# Patient Record
Sex: Female | Born: 1955 | Race: Black or African American | Hispanic: No | Smoking: Never smoker
Health system: Southern US, Community
[De-identification: ages and names within clinical notes are randomized; demographics above are authoritative.]

## PROBLEM LIST (undated history)

## (undated) DIAGNOSIS — E785 Hyperlipidemia, unspecified: Secondary | ICD-10-CM

## (undated) DIAGNOSIS — S3992XA Unspecified injury of lower back, initial encounter: Secondary | ICD-10-CM

## (undated) HISTORY — DX: Unspecified injury of lower back, initial encounter: S39.92XA

## (undated) HISTORY — DX: Hyperlipidemia, unspecified: E78.5

---

## 1987-07-18 HISTORY — PX: ABDOMINAL HYSTERECTOMY: SHX81

## 2003-04-24 ENCOUNTER — Encounter: Admission: RE | Admit: 2003-04-24 | Discharge: 2003-04-24 | Payer: Self-pay | Admitting: Specialist

## 2003-04-24 ENCOUNTER — Encounter: Payer: Self-pay | Admitting: Specialist

## 2007-12-03 ENCOUNTER — Encounter: Admission: RE | Admit: 2007-12-03 | Discharge: 2007-12-03 | Payer: Self-pay | Admitting: Family Medicine

## 2008-07-17 HISTORY — PX: HEMORRHOIDECTOMY WITH HEMORRHOID BANDING: SHX5633

## 2008-08-07 LAB — HM COLONOSCOPY

## 2008-12-03 ENCOUNTER — Encounter: Admission: RE | Admit: 2008-12-03 | Discharge: 2008-12-03 | Payer: Self-pay | Admitting: Family Medicine

## 2008-12-07 ENCOUNTER — Encounter: Admission: RE | Admit: 2008-12-07 | Discharge: 2008-12-07 | Payer: Self-pay | Admitting: Family Medicine

## 2009-12-06 ENCOUNTER — Encounter: Admission: RE | Admit: 2009-12-06 | Discharge: 2009-12-06 | Payer: Self-pay | Admitting: Family Medicine

## 2010-08-08 ENCOUNTER — Encounter: Payer: Self-pay | Admitting: Family Medicine

## 2010-11-16 ENCOUNTER — Other Ambulatory Visit: Payer: Self-pay | Admitting: Family Medicine

## 2010-11-16 DIAGNOSIS — Z1231 Encounter for screening mammogram for malignant neoplasm of breast: Secondary | ICD-10-CM

## 2010-12-09 ENCOUNTER — Ambulatory Visit
Admission: RE | Admit: 2010-12-09 | Discharge: 2010-12-09 | Disposition: A | Discharge status: Home / Self Care | Payer: 59 | Source: Ambulatory Visit | Attending: Family Medicine | Admitting: Family Medicine

## 2010-12-09 DIAGNOSIS — Z1231 Encounter for screening mammogram for malignant neoplasm of breast: Secondary | ICD-10-CM

## 2011-10-16 ENCOUNTER — Ambulatory Visit (INDEPENDENT_AMBULATORY_CARE_PROVIDER_SITE_OTHER): Payer: BC Managed Care – PPO | Admitting: Internal Medicine

## 2011-10-16 ENCOUNTER — Encounter: Payer: Self-pay | Admitting: Internal Medicine

## 2011-10-16 VITALS — BP 152/96 | HR 78 | Temp 98.3°F | Ht 65.0 in | Wt 176.0 lb

## 2011-10-16 DIAGNOSIS — R030 Elevated blood-pressure reading, without diagnosis of hypertension: Secondary | ICD-10-CM

## 2011-10-16 DIAGNOSIS — R635 Abnormal weight gain: Secondary | ICD-10-CM | POA: Insufficient documentation

## 2011-10-16 DIAGNOSIS — Z0000 Encounter for general adult medical examination without abnormal findings: Secondary | ICD-10-CM | POA: Insufficient documentation

## 2011-10-16 DIAGNOSIS — E785 Hyperlipidemia, unspecified: Secondary | ICD-10-CM | POA: Insufficient documentation

## 2011-10-16 DIAGNOSIS — IMO0001 Reserved for inherently not codable concepts without codable children: Secondary | ICD-10-CM

## 2011-10-16 LAB — CBC WITH DIFFERENTIAL/PLATELET
Basophils Relative: 0.9 % (ref 0.0–3.0)
Eosinophils Relative: 3.7 % (ref 0.0–5.0)
HCT: 41 % (ref 36.0–46.0)
Lymphs Abs: 2.9 10*3/uL (ref 0.7–4.0)
MCV: 87.4 fl (ref 78.0–100.0)
Monocytes Absolute: 0.4 10*3/uL (ref 0.1–1.0)
Neutrophils Relative %: 33.2 % — ABNORMAL LOW (ref 43.0–77.0)
RBC: 4.69 Mil/uL (ref 3.87–5.11)
WBC: 5.3 10*3/uL (ref 4.5–10.5)

## 2011-10-16 LAB — COMPREHENSIVE METABOLIC PANEL
Albumin: 4.3 g/dL (ref 3.5–5.2)
Alkaline Phosphatase: 56 U/L (ref 39–117)
BUN: 15 mg/dL (ref 6–23)
CO2: 29 mEq/L (ref 19–32)
Calcium: 9.1 mg/dL (ref 8.4–10.5)
GFR: 81.34 mL/min (ref >60.00)
Glucose, Bld: 106 mg/dL — ABNORMAL HIGH (ref 70–99)
Potassium: 3.5 mEq/L (ref 3.5–5.1)

## 2011-10-16 LAB — LIPID PANEL
Cholesterol: 228 mg/dL — ABNORMAL HIGH (ref 0–200)
VLDL: 14 mg/dL (ref 0.0–40.0)

## 2011-10-16 NOTE — Assessment & Plan Note (Signed)
Gaining weight for the last 2 years, reports about 7 pounds, thinks is related to lack of exercise due to being very busy. Plan: Labs Diet exercise discussed

## 2011-10-16 NOTE — Patient Instructions (Addendum)
Check the  blood pressure 2 or 3 times a week, be sure it is less than 135/85   Sodium-Controlled Diet Sodium is a mineral. It is found in many foods. Sodium may be found naturally or added during the making of a food. The most common form of sodium is salt, which is made up of sodium and chloride. Reducing your sodium intake involves changing your eating habits. The following guidelines will help you reduce the sodium in your diet:  Stop using the salt shaker.   Use salt sparingly in cooking and baking.   Substitute with sodium-free seasonings and spices.   Do not use a salt substitute (potassium chloride) without your caregiver's permission.   Include a variety of fresh, unprocessed foods in your diet.   Limit the use of processed and convenience foods that are high in sodium.  USE THE FOLLOWING FOODS SPARINGLY: Breads/Starches  Commercial bread stuffing, commercial pancake or waffle mixes, coating mixes. Waffles. Croutons. Prepared (boxed or frozen) potato, rice, or noodle mixes that contain salt or sodium. Salted [place] fries or hash browns. Salted popcorn, breads, crackers, chips, or snack foods.  Vegetables  Vegetables canned with salt or prepared in cream, butter, or cheese sauces. Sauerkraut. Tomato or vegetable juices canned with salt.   Fresh vegetables are allowed if rinsed thoroughly.  Fruit  Fruit is okay to eat.  Meat and Meat Substitutes  Salted or smoked meats, such as bacon or Canadian bacon, chipped or corned beef, hot dogs, salt pork, luncheon meats, pastrami, ham, or sausage. Canned or smoked fish, poultry, or meat. Processed cheese or cheese spreads, blue or Roquefort cheese. Battered or frozen fish products. Prepared spaghetti sauce. Baked beans. Reuben sandwiches. Salted nuts. Caviar.  Milk  Limit buttermilk to 1 cup per week.  Soups and Combination Foods  Bouillon cubes, canned or dried soups, broth, consomm. Convenience (frozen or packaged) dinners with  more than 600 mg sodium. Pot pies, pizza, Asian food, fast food cheeseburgers, and specialty sandwiches.  Desserts and Sweets  Regular (salted) desserts, pie, commercial fruit snack pies, commercial snack cakes, canned puddings.   Eat desserts and sweets in moderation.  Fats and Oils  Gravy mixes or canned gravy. No more than 1 to 2 tbs of salad dressing. Chip dips.   Eat fats and oils in moderation.  Beverages  See those listed under the vegetables and milk groups.  Condiments  Ketchup, mustard, meat sauces, salsa, regular (salted) and lite soy sauce or mustard. Dill pickles, olives, meat tenderizer. Prepared horseradish or pickle relish. Dutch-processed cocoa. Baking powder or baking soda used medicinally. Worcestershire sauce. "Light" salt. Salt substitute, unless approved by your caregiver.  Document Released: 12/23/2001 Document Revised: 06/22/2011 Document Reviewed: 07/26/2009 [facility] Patient Information 2012 [employer]85. If it is consistently higher, let me know

## 2011-10-16 NOTE — Assessment & Plan Note (Signed)
History of high cholesterol, took Crestor one time for 1 week and developed myalgias. Currently on no medications. Labs. Diet discussed.

## 2011-10-16 NOTE — Assessment & Plan Note (Addendum)
Reports a colonoscopy in 2009. Refer to gynecology. Next MMG due 11-2011

## 2011-10-16 NOTE — Assessment & Plan Note (Signed)
BP noted to be slightly elevated today, no history of hypertension, no ambulatory BPs. Will ask patient to check her BP at home. See instructions

## 2011-10-16 NOTE — Progress Notes (Signed)
  Subjective:    Patient ID: Anne Ramirez, female    DOB: 03-Nov-1955, 56 y.o.   MRN: [id number]  HPI New patient Complaint of weight gain for the last year, reports no change in her diet which is usually healthy but has not been able to exercise as much because she is very busy. Has  noted some axillary dimpling. Also has a history of high cholesterol, briefly took Crestor in the past and d/c it due to myalgias. Her BP is noted to be elevated today, she has no history of hypertension, has not checked her BPs at home.  Past medical history Hyperlipidemia, took crestor x 1 week, self d/c (myalgias) Back Injury 2007 Shoulder injury 2011, sees orthopedic surgery when necessary. 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 Denies any anxiety or depression    Objective:   Physical Exam  General -- alert, well-developed, and well-nourished. NAD  Neck --no LADs, no thyromegaly Lungs -- normal respiratory effort, no intercostal retractions, no accessory muscle use, and normal breath sounds.   Heart-- normal rate, regular rhythm, no murmur, and no gallop.   Extremities-- no pretibial edema bilaterally . Axillary areas normal inspection and palpation. No mass or lymphadenopathy noted. No peau d' orange  or dimpling      Assessment & Plan:

## 2011-10-18 ENCOUNTER — Ambulatory Visit: Payer: 59 | Admitting: Internal Medicine

## 2011-10-18 LAB — TSH: TSH: 1.17 u[IU]/mL (ref 0.35–5.50)

## 2011-10-20 ENCOUNTER — Encounter: Payer: Self-pay | Admitting: Internal Medicine

## 2011-10-24 ENCOUNTER — Encounter: Payer: Self-pay | Admitting: *Deleted

## 2012-01-11 ENCOUNTER — Other Ambulatory Visit: Payer: Self-pay | Admitting: Internal Medicine

## 2012-01-11 DIAGNOSIS — Z1231 Encounter for screening mammogram for malignant neoplasm of breast: Secondary | ICD-10-CM

## 2012-01-30 ENCOUNTER — Ambulatory Visit
Admission: RE | Admit: 2012-01-30 | Discharge: 2012-01-30 | Disposition: A | Discharge status: Home / Self Care | Payer: BC Managed Care – PPO | Source: Ambulatory Visit | Attending: Internal Medicine | Admitting: Internal Medicine

## 2012-01-30 DIAGNOSIS — Z1231 Encounter for screening mammogram for malignant neoplasm of breast: Secondary | ICD-10-CM

## 2012-04-18 ENCOUNTER — Ambulatory Visit (INDEPENDENT_AMBULATORY_CARE_PROVIDER_SITE_OTHER): Payer: BC Managed Care – PPO | Admitting: Internal Medicine

## 2012-04-18 ENCOUNTER — Encounter: Payer: Self-pay | Admitting: *Deleted

## 2012-04-18 VITALS — BP 136/84 | HR 79 | Temp 98.8°F | Wt 176.0 lb

## 2012-04-18 DIAGNOSIS — B349 Viral infection, unspecified: Secondary | ICD-10-CM

## 2012-04-18 DIAGNOSIS — B9789 Other viral agents as the cause of diseases classified elsewhere: Secondary | ICD-10-CM

## 2012-04-18 NOTE — Patient Instructions (Addendum)
Rest, fluids , tylenol If mild cough, congestion, try Mucinex DM twice a day as needed  Call if no better in few days Call anytime if the symptoms are severe, you have high fever,  headache, rash, severe respiratory symptoms

## 2012-04-18 NOTE — Progress Notes (Signed)
  Subjective:    Patient ID: Anne Ramirez, female    DOB: [birth year], 56 y.o.   MRN: [id number]  HPI Acute visit Yesterday developed generalized myalgias with essentially no arthralgias, she felt unwell in general. Started to take Tylenol frequently, thinks she may have the flu. Her daughter has what seems to be a URI.  Past medical history Hyperlipidemia, took crestor x 1 week, self d/c (myalgias) Back Injury 2007 Shoulder injury 2011, sees orthopedic surgery when necessary. 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 She reports subjective fevers but no chills Denies cough, nausea, vomiting, diarrhea. No dysuria or gross hematuria No rash or headaches.     Objective:   Physical Exam General -- alert, well-developed,. No apparent distress.  HEENT -- TMs normal, throat w/o redness, face symmetric and not tender to palpation, nose not congested , some hoarseness  Lungs -- normal respiratory effort, no intercostal retractions, no accessory muscle use, and normal breath sounds.   Heart-- normal rate, regular rhythm, no murmur, and no gallop.   Extremities-- no pretibial edema bilaterally  Neurologic-- alert & oriented X3 and strength normal in all extremities. Psych-- Cognition and judgment appear intact. Alert and cooperative with normal attention span and concentration.  not anxious appearing and not depressed appearing.       Assessment & Plan:  Viral syndrome? One-day history of myalgias without respiratory symptoms except for some  hoarseness. Flu Test (-) I don't see much evidence of a flu infection at this point. Recommend conservative treatment. Pt request a flu shot, given sx , rec to wait few days

## 2012-04-19 ENCOUNTER — Encounter: Payer: Self-pay | Admitting: Internal Medicine

## 2012-05-19 ENCOUNTER — Encounter (HOSPITAL_COMMUNITY): Payer: Self-pay | Admitting: Emergency Medicine

## 2012-05-19 ENCOUNTER — Emergency Department (HOSPITAL_COMMUNITY)
Admission: EM | Admit: 2012-05-19 | Discharge: 2012-05-19 | Disposition: A | Discharge status: Home / Self Care | Payer: Worker's Compensation | Attending: Emergency Medicine | Admitting: Emergency Medicine

## 2012-05-19 DIAGNOSIS — Y9389 Activity, other specified: Secondary | ICD-10-CM | POA: Insufficient documentation

## 2012-05-19 DIAGNOSIS — Y990 Civilian activity done for income or pay: Secondary | ICD-10-CM | POA: Insufficient documentation

## 2012-05-19 DIAGNOSIS — X500XXA Overexertion from strenuous movement or load, initial encounter: Secondary | ICD-10-CM | POA: Insufficient documentation

## 2012-05-19 DIAGNOSIS — Y9210 Unspecified residential institution as the place of occurrence of the external cause: Secondary | ICD-10-CM | POA: Insufficient documentation

## 2012-05-19 DIAGNOSIS — Z8619 Personal history of other infectious and parasitic diseases: Secondary | ICD-10-CM | POA: Insufficient documentation

## 2012-05-19 DIAGNOSIS — S335XXA Sprain of ligaments of lumbar spine, initial encounter: Secondary | ICD-10-CM | POA: Insufficient documentation

## 2012-05-19 DIAGNOSIS — S39012A Strain of muscle, fascia and tendon of lower back, initial encounter: Secondary | ICD-10-CM

## 2012-05-19 LAB — RAPID URINE DRUG SCREEN, HOSP PERFORMED
Amphetamines: NOT DETECTED
Barbiturates: NOT DETECTED
Benzodiazepines: NOT DETECTED
Cocaine: NOT DETECTED
Tetrahydrocannabinol: NOT DETECTED

## 2012-05-19 MED ORDER — CYCLOBENZAPRINE HCL 5 MG PO TABS: 5.0000 mg | ORAL_TABLET | Freq: Every evening | ORAL | Status: DC | PRN

## 2012-05-19 MED ORDER — PREDNISONE 20 MG PO TABS: 20.0000 mg | ORAL_TABLET | Freq: Every day | ORAL | Status: DC

## 2012-05-19 MED ORDER — IBUPROFEN 800 MG PO TABS
800.0000 mg | ORAL_TABLET | Freq: Once | ORAL | Status: AC
  Administered 2012-05-19: 800 mg via ORAL
  Filled 2012-05-19: qty 1

## 2012-05-19 MED ORDER — ACETAMINOPHEN 500 MG PO TABS
500.0000 mg | ORAL_TABLET | Freq: Four times a day (QID) | ORAL | Status: DC | PRN
Start: 2012-05-19 — End: 2017-03-27
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
## 2012-05-19 NOTE — ED Provider Notes (Signed)
Medical screening examination/treatment/procedure(s) were performed by non-physician practitioner and as supervising physician I was immediately available for consultation/collaboration.  Doug Sou, MD 05/19/12 [phone]

## 2012-05-19 NOTE — ED Provider Notes (Signed)
History     CSN: [id number]  Arrival date & time 05/19/12  0907   None     Chief Complaint  Patient presents with  . Back Pain    (Consider location/radiation/quality/duration/timing/severity/associated sxs/prior treatment) HPI  Pt to the ER for evaluation of left side low back pain. 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 She did not fall. She did not have immediate pain. She woke up Saturday morning really sore and then this morning woke up feeling much worse and not wanting to ambulate because of the pain. She denies weakness, numbness tingling in the leg. She denies midline tenderness/pain or pain of her hip girdle. She has not had any associated systemic symptoms. She is a Engineer, civil (consulting) here at [employer] and requests a drug screen be added. nad vss  Past Medical History  Diagnosis Date  . Chicken pox     Past Surgical History  Procedure Date  . Abdominal hysterectomy 1989  . Hemorrhoidectomy with hemorrhoid banding     Family History  Problem Relation Age of Onset  . Kidney disease Brother   . Hyperlipidemia Maternal Grandmother   . Stroke Maternal Grandmother   . Hypertension Maternal Grandmother   . Hyperlipidemia Maternal Grandfather   . Stroke Maternal Grandfather   . Hypertension Maternal Grandfather   . Hyperlipidemia Paternal Grandmother   . Stroke Paternal Grandmother   . Hypertension Paternal Grandmother   . Hyperlipidemia Paternal Grandfather   . Stroke Paternal Grandfather   . Hypertension Paternal Grandfather     History  Substance Use Topics  . Smoking status: Never Smoker   . Smokeless tobacco: Never Used  . Alcohol Use: No    OB History    Grav Para Term Preterm Abortions TAB SAB Ect Mult Living                  Review of Systems  Review of Systems  Gen: no weight loss, fevers, chills, night sweats  Eyes: no discharge or  drainage, no occular pain or visual changes  Nose: no epistaxis or rhinorrhea  Mouth: no dental pain, no sore throat  Neck: no neck pain  Lungs:No wheezing, coughing or hemoptysis CV: no chest pain, palpitations, dependent edema or orthopnea  Abd: no abdominal pain, nausea, vomiting  GU: no dysuria or gross hematuria  MSK:  Left low back injury  Neuro: no headache, no focal neurologic deficits  Skin: no abnormalities Psyche: negative.   Allergies  Other  Home Medications   Current Outpatient Rx  Name  Route  Sig  Dispense  Refill  . IBUPROFEN 200 MG PO TABS   Oral   Take 400 mg by mouth every 6 (six) hours as needed. For pain         . NAPROXEN SODIUM 220 MG PO TABS   Oral   Take 220-440 mg by mouth daily as needed. For pain         . ACETAMINOPHEN 500 MG PO TABS   Oral   Take 1 tablet (500 mg total) by mouth every 6 (six) hours as needed for pain.   30 tablet   0   . CYCLOBENZAPRINE HCL 5 MG PO TABS   Oral   Take 1 tablet (5 mg total) by mouth at bedtime as needed for muscle spasms.   30 tablet   0   .  PREDNISONE 20 MG PO TABS   Oral   Take 1 tablet (20 mg total) by mouth daily.   21 tablet   0     6 tabs on day 1, 5 tabs on day 2, 4 tabs on day 3, ...     BP 140/89  Pulse 76  Temp 98.1 F (36.7 C) (Oral)  Resp 20  Ht 5\' 5"  (1.651 m)  Wt 168 lb (76.204 kg)  BMI 27.96 kg/m2  SpO2 99%  Physical Exam  Nursing note and vitals reviewed. Constitutional: She appears well-developed and well-nourished. No distress.  HENT:  Head: Normocephalic and atraumatic.  Eyes: Pupils are equal, round, and reactive to light.  Neck: Normal range of motion. Neck supple.  Cardiovascular: Normal rate and regular rhythm.   Pulmonary/Chest: Effort normal.  Abdominal: Soft.  Musculoskeletal:       Lumbar back: She exhibits tenderness (tenderness to palpation of the soft tissue of the left low back.) and spasm. She exhibits normal range of motion, no bony tenderness, no  swelling, no edema, no deformity, no laceration, no pain and normal pulse.       Back:        Equal strength to bilateral lower extremities. Neurosensory function adequate to both legs. Skin color is normal. Skin is warm and moist. I see no step off deformity, no bony tenderness. Pain is relieved when sitting in certain positions. ROM is decreased due to pain. No crepitus, laceration, effusion, swelling.  Pulses are normal  Pain does not involve boney structures.   Neurological: She is alert.  Skin: Skin is warm and dry.    ED Course  Procedures (including critical care time)   Labs Reviewed  URINE RAPID DRUG SCREEN (HOSP PERFORMED)   No results found.   1. Low back strain       MDM  Pt did not have impact with object or fall onto floor. Her pain is clearly only involving the soft tissues of her low back. She does not feel or want and xray and I think that this is appropriate. She needs a urine drug screen and I have ordered it. She says that narcotics make her sleepy. Prednisone dose back, Tylenol and Flexeril Rx.  Pt given a referral to Dr. Ophelia Charter.  Pt has been advised of the symptoms that warrant their return to the ED. Patient has voiced understanding and has agreed to follow-up with the PCP or specialist.         Dorthula Matas, PA 05/19/12 1014

## 2012-10-29 ENCOUNTER — Other Ambulatory Visit: Payer: Self-pay

## 2012-10-29 DIAGNOSIS — Z1231 Encounter for screening mammogram for malignant neoplasm of breast: Secondary | ICD-10-CM

## 2012-11-25 ENCOUNTER — Ambulatory Visit (INDEPENDENT_AMBULATORY_CARE_PROVIDER_SITE_OTHER): Payer: BC Managed Care – PPO | Admitting: Internal Medicine

## 2012-11-25 ENCOUNTER — Encounter: Payer: Self-pay | Admitting: Internal Medicine

## 2012-11-25 VITALS — BP 136/84 | HR 75 | Temp 98.1°F | Ht 65.0 in | Wt 174.0 lb

## 2012-11-25 DIAGNOSIS — Z0000 Encounter for general adult medical examination without abnormal findings: Secondary | ICD-10-CM

## 2012-11-25 LAB — LDL CHOLESTEROL, DIRECT: Direct LDL: 163.2 mg/dL

## 2012-11-25 LAB — CBC WITH DIFFERENTIAL/PLATELET
Basophils Absolute: 0.1 10*3/uL (ref 0.0–0.1)
Basophils Relative: 0.9 % (ref 0.0–3.0)
Eosinophils Absolute: 0.2 10*3/uL (ref 0.0–0.7)
HCT: 42.9 % (ref 36.0–46.0)
Hemoglobin: 14.4 g/dL (ref 12.0–15.0)
Lymphs Abs: 3.3 10*3/uL (ref 0.7–4.0)
MCHC: 33.5 g/dL (ref 30.0–36.0)
Neutro Abs: 2.5 10*3/uL (ref 1.4–7.7)
RBC: 5.06 Mil/uL (ref 3.87–5.11)
RDW: 14.4 % (ref 11.5–14.6)

## 2012-11-25 LAB — COMPREHENSIVE METABOLIC PANEL
ALT: 17 U/L (ref 0–35)
AST: 16 U/L (ref 0–37)
Alkaline Phosphatase: 57 U/L (ref 39–117)
BUN: 15 mg/dL (ref 6–23)
Creatinine, Ser: 0.7 mg/dL (ref 0.4–1.2)
Total Bilirubin: 0.6 mg/dL (ref 0.3–1.2)

## 2012-11-25 LAB — LIPID PANEL: VLDL: 29.6 mg/dL (ref 0.0–40.0)

## 2012-11-25 LAB — TSH: TSH: 0.73 u[IU]/mL (ref 0.35–5.50)

## 2012-11-25 NOTE — Patient Instructions (Addendum)
Zostavax? Check the  blood pressure 2 or 3 times a month, be sure it is between 110/60 and 140/85. If it is consistently higher or lower, let me know Next mammogram 01-2013, if you need a referral l, let me know Next visit here 1 year

## 2012-11-25 NOTE — Assessment & Plan Note (Addendum)
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 Will ask [facility]  Gynecology last year, no records, will ask for records  MMG 01-2012 neg, breast exam today (-) Diet-exercise discussed  See instructions Addendum:  Colonoscopy 08/07/2008, no polyps, repeat in 5 years Dr. Juliann Mule gynecology 11/13/2011, Pap smear negative, HPV negative.Dr Ernestina Penna

## 2012-11-25 NOTE — Progress Notes (Signed)
  Subjective:    Patient ID: Anne Ramirez, female    DOB: [birth year], 57 y.o.   MRN: [id number]  HPI CPX  Past medical history Hyperlipidemia, took crestor x 1 week, self d/c (myalgias) Back Injury 2007 Shoulder injury 2011, sees orthopedic surgery when necessary. 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 No dysuria or gross hematuria. No vaginal bleeding. occ back ache-shoulder pain, on tylenol prn    Objective:   Physical Exam BP 136/84  Pulse 75  Temp(Src) 98.1 F (36.7 C) (Oral)  Ht 5\' 5"  (1.651 m)  Wt 174 lb (78.926 kg)  BMI 28.96 kg/m2  SpO2 94%  General -- alert, well-developed,  Neck --no thyromegaly Breasts-- No mass, nodules, thickening, tenderness, bulging, retraction, inflamation, nipple discharge or skin changes noted.  no axillary lymph nodes Lungs -- normal respiratory effort, no intercostal retractions, no accessory muscle use, and normal breath sounds.   Heart-- normal rate, regular rhythm, no murmur, and no gallop.   Abdomen--soft, non-tender, no distention, no masses, no HSM, no guarding, and no rigidity.   Extremities-- no pretibial edema bilaterally  Neurologic-- alert & oriented X3 and strength normal in all extremities. Psych-- Cognition and judgment appear intact. Alert and cooperative with normal attention span and concentration.  not anxious appearing and not depressed appearing.        Assessment & Plan:

## 2012-12-03 ENCOUNTER — Encounter: Payer: Self-pay | Admitting: *Deleted

## 2012-12-12 LAB — HM PAP SMEAR: HM Pap smear: NEGATIVE

## 2012-12-17 ENCOUNTER — Encounter: Payer: Self-pay | Admitting: Internal Medicine

## 2013-02-07 ENCOUNTER — Ambulatory Visit
Admission: RE | Admit: 2013-02-07 | Discharge: 2013-02-07 | Disposition: A | Discharge status: Home / Self Care | Payer: BC Managed Care – PPO | Source: Ambulatory Visit

## 2013-02-07 DIAGNOSIS — Z1231 Encounter for screening mammogram for malignant neoplasm of breast: Secondary | ICD-10-CM

## 2013-04-04 ENCOUNTER — Encounter: Payer: Self-pay | Admitting: Internal Medicine

## 2013-04-09 NOTE — Telephone Encounter (Signed)
Error

## 2013-11-25 ENCOUNTER — Telehealth: Payer: Self-pay

## 2013-11-25 NOTE — Telephone Encounter (Signed)
Phone number switches to fax machine  Pap--11/2012--neg--neg HPV--Dr Fogleman [facility] --2010--q 5 years--Dr Bosie ClosSchooler MMG---01/2013---Neg Bi Rads

## 2013-11-26 ENCOUNTER — Telehealth: Payer: Self-pay | Admitting: Internal Medicine

## 2013-11-26 ENCOUNTER — Encounter: Payer: BC Managed Care – PPO | Admitting: Internal Medicine

## 2013-11-26 NOTE — Telephone Encounter (Signed)
Caller name:  Rielle  Call back number:[phone]   Reason for call:    [facility]  Call back after 11am

## 2013-12-01 ENCOUNTER — Ambulatory Visit (INDEPENDENT_AMBULATORY_CARE_PROVIDER_SITE_OTHER): Payer: BC Managed Care – PPO | Admitting: Internal Medicine

## 2013-12-01 ENCOUNTER — Encounter: Payer: Self-pay | Admitting: Internal Medicine

## 2013-12-01 VITALS — BP 148/89 | HR 73 | Temp 98.4°F | Ht 65.0 in | Wt 173.0 lb

## 2013-12-01 DIAGNOSIS — Z0000 Encounter for general adult medical examination without abnormal findings: Secondary | ICD-10-CM

## 2013-12-01 DIAGNOSIS — IMO0001 Reserved for inherently not codable concepts without codable children: Secondary | ICD-10-CM

## 2013-12-01 DIAGNOSIS — R030 Elevated blood-pressure reading, without diagnosis of hypertension: Secondary | ICD-10-CM

## 2013-12-01 NOTE — Assessment & Plan Note (Signed)
BP again slt elevated , willcontinue monitoring it at home

## 2013-12-01 NOTE — Patient Instructions (Signed)
Get your blood work before you leave   Next visit is for a physical exam in 1 year ,  fasting Please make an appointment     Check the  blood pressure 2  times a month   be sure it is between 110/60 and 140/85. Ideal blood pressure is 120/80. If it is consistently higher or lower, let me know

## 2013-12-01 NOTE — Assessment & Plan Note (Addendum)
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 Bosie ClosSchooler----> will arrange   [facility], Pap smear negative, HPV negative.Dr Ernestina PennaFogleman MMG [id number] neg   Diet-exercise discussed EKG normal sinus rhythm, no acute changes, no previous EKGs to compare with.

## 2013-12-01 NOTE — Progress Notes (Signed)
Subjective:    Patient ID: Anne Ramirez, female    DOB: February 02, 1956, 58 y.o.   MRN: [id number]  DOS:  12/01/2013 Type of  visit: CPX, in general feeling well, no concerns  ROS Diet-- healthy Exercise-- active, yard work  No  CP, SOB No palpitations, no lower extremity edema Denies  nausea, vomiting diarrhea Denies  blood in the stools (-) cough, sputum production (-) wheezing, chest congestion No dysuria, gross hematuria, difficulty urinating  No anxiety, depression    Past Medical History  Diagnosis Date  . Hyperlipidemia     h/o myalgias w/ crestor  . Back injury     2007    Past Surgical History  Procedure Laterality Date  . Abdominal hysterectomy  1989    Hyst and oophorectomy  . Hemorrhoidectomy with hemorrhoid banding  2010    History   Social History  . Marital Status: Unknown    Spouse Name: N/A    Number of Children: 2  . Years of Education: N/A   Occupational History  . RN    Social History Main Topics  . Smoking status: Never Smoker   . Smokeless tobacco: Never Used  . Alcohol Use: No  . Drug Use: No  . Sexual Activity:    Other Topics Concern  . 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 Kidney disease Brother   . Hyperlipidemia Maternal Grandmother   . Stroke Maternal Grandmother   . Hypertension Maternal Grandmother   . Hyperlipidemia Maternal Grandfather   . Stroke Maternal Grandfather   . Hypertension Maternal Grandfather   . Hyperlipidemia Paternal Grandmother   . Stroke Paternal Grandmother   . Hypertension Paternal Grandmother   . Hyperlipidemia Paternal Grandfather   . Stroke Paternal Grandfather   . Hypertension Paternal Grandfather   . Colon cancer Neg Hx   . Breast cancer Neg Hx         Medication List       This list is accurate as of: 12/01/13 11:59 PM.  Always use your most recent med list.               acetaminophen 500 MG tablet  Commonly known as:  TYLENOL  Take 1 tablet (500 mg total) by mouth every 6 (six) hours as needed for pain.           Objective:   Physical Exam BP 148/89  Pulse 73  Temp(Src) 98.4 F (36.9 C) (Oral)  Ht 5\' 5"  (1.651 m)  Wt 173 lb (78.472 kg)  BMI 28.79 kg/m2  SpO2 98%  General -- alert, well-developed, NAD.  Neck --no thyromegaly  HEENT-- Not pale.  Lungs -- normal respiratory effort, no intercostal retractions, no accessory muscle use, and normal breath sounds.  Heart-- normal rate, regular rhythm, no murmur.  Abdomen-- Not distended, good bowel sounds,soft, non-tender.  Extremities-- no pretibial edema bilaterally  Neurologic--  alert & oriented X3. Speech normal, gait normal, strength normal in all extremities.  Psych-- Cognition and judgment appear intact. Cooperative with normal attention span and concentration. No anxious or depressed appearing.          Assessment & Plan:

## 2013-12-01 NOTE — Progress Notes (Signed)
Pre-visit discussion using our clinic review tool. No additional management support is needed unless otherwise documented below in the visit note.  

## 2013-12-02 LAB — CBC WITH DIFFERENTIAL/PLATELET
BASOS PCT: 1.4 % (ref 0.0–3.0)
Basophils Absolute: 0.1 10*3/uL (ref 0.0–0.1)
EOS PCT: 3.2 % (ref 0.0–5.0)
Eosinophils Absolute: 0.2 10*3/uL (ref 0.0–0.7)
HEMATOCRIT: 43.2 % (ref 36.0–46.0)
Hemoglobin: 14.1 g/dL (ref 12.0–15.0)
LYMPHS ABS: 3.4 10*3/uL (ref 0.7–4.0)
Lymphocytes Relative: 47.7 % — ABNORMAL HIGH (ref 12.0–46.0)
MCHC: 32.7 g/dL (ref 30.0–36.0)
MCV: 87.7 fl (ref 78.0–100.0)
MONO ABS: 0.3 10*3/uL (ref 0.1–1.0)
Monocytes Relative: 4.8 % (ref 3.0–12.0)
Neutro Abs: 3.1 10*3/uL (ref 1.4–7.7)
Neutrophils Relative %: 42.9 % — ABNORMAL LOW (ref 43.0–77.0)
Platelets: 315 10*3/uL (ref 150.0–400.0)
RBC: 4.93 Mil/uL (ref 3.87–5.11)
RDW: 14.4 % (ref 11.5–15.5)
WBC: 7.2 10*3/uL (ref 4.0–10.5)

## 2013-12-02 LAB — COMPREHENSIVE METABOLIC PANEL
ALT: 13 U/L (ref 0–35)
AST: 19 U/L (ref 0–37)
Albumin: 4.6 g/dL (ref 3.5–5.2)
Alkaline Phosphatase: 64 U/L (ref 39–117)
BUN: 11 mg/dL (ref 6–23)
CALCIUM: 9.6 mg/dL (ref 8.4–10.5)
CHLORIDE: 102 meq/L (ref 96–112)
CO2: 30 meq/L (ref 19–32)
CREATININE: 0.8 mg/dL (ref 0.4–1.2)
GFR: 80.72 mL/min (ref >60.00)
Glucose, Bld: 85 mg/dL (ref 70–99)
Potassium: 4.1 mEq/L (ref 3.5–5.1)
Sodium: 138 mEq/L (ref 135–145)
TOTAL PROTEIN: 7.8 g/dL (ref 6.0–8.3)
Total Bilirubin: 0.7 mg/dL (ref 0.2–1.2)

## 2013-12-02 LAB — VITAMIN D 25 HYDROXY (VIT D DEFICIENCY, FRACTURES): Vit D, 25-Hydroxy: 22 ng/mL — ABNORMAL LOW (ref 30–89)

## 2013-12-02 LAB — LIPID PANEL
Cholesterol: 242 mg/dL — ABNORMAL HIGH (ref 0–200)
HDL: 57.4 mg/dL (ref >39.00)
LDL Cholesterol: 163 mg/dL — ABNORMAL HIGH (ref 0–99)
Total CHOL/HDL Ratio: 4
Triglycerides: 108 mg/dL (ref 0.0–149.0)
VLDL: 21.6 mg/dL (ref 0.0–40.0)

## 2013-12-02 LAB — HEMOGLOBIN A1C: Hgb A1c MFr Bld: 5.9 % (ref 4.6–6.5)

## 2013-12-04 ENCOUNTER — Encounter: Payer: Self-pay | Admitting: *Deleted

## 2013-12-04 NOTE — Progress Notes (Signed)
Letter sent to patient with lab results.

## 2014-01-08 ENCOUNTER — Telehealth: Payer: Self-pay | Admitting: *Deleted

## 2014-01-08 NOTE — Telephone Encounter (Signed)
Received Physical Examination Form for school paperwork via fax from the patient.  Billing sheet attached,forms filled out as much as possible and placed in folder for Dr. Drue NovelPaz to complete and sign.//AB/CMA

## 2014-01-09 DIAGNOSIS — Z0279 Encounter for issue of other medical certificate: Secondary | ICD-10-CM

## 2014-01-19 NOTE — Telephone Encounter (Signed)
Received completed and signed Physical Examination Form from Dr. Drue NovelPaz.  All forms faxed to [facility]-Att: [phone]  Confirmation received.//AB/CMA

## 2014-01-22 ENCOUNTER — Other Ambulatory Visit: Payer: Self-pay

## 2014-01-22 DIAGNOSIS — Z1231 Encounter for screening mammogram for malignant neoplasm of breast: Secondary | ICD-10-CM

## 2014-02-11 ENCOUNTER — Encounter (INDEPENDENT_AMBULATORY_CARE_PROVIDER_SITE_OTHER): Payer: Self-pay

## 2014-02-11 ENCOUNTER — Ambulatory Visit
Admission: RE | Admit: 2014-02-11 | Discharge: 2014-02-11 | Disposition: A | Discharge status: Home / Self Care | Payer: BC Managed Care – PPO | Source: Ambulatory Visit

## 2014-02-11 DIAGNOSIS — Z1231 Encounter for screening mammogram for malignant neoplasm of breast: Secondary | ICD-10-CM

## 2015-02-15 ENCOUNTER — Telehealth: Payer: Self-pay | Admitting: Internal Medicine

## 2015-02-15 ENCOUNTER — Other Ambulatory Visit: Payer: Self-pay | Admitting: Internal Medicine

## 2015-02-15 DIAGNOSIS — Z1231 Encounter for screening mammogram for malignant neoplasm of breast: Secondary | ICD-10-CM

## 2015-02-15 NOTE — Telephone Encounter (Signed)
Contacted pt was unable to leave a message on voicemail

## 2015-02-15 NOTE — Telephone Encounter (Signed)
Okay to put 2-15 minute appts together.  

## 2015-02-15 NOTE — Telephone Encounter (Signed)
Caller name:Jelinek Glee Relation to ZO:XWRU Call back [phone] Pharmacy:  Reason for call: pt needs to get a CPE before November states she will no longer have her insurance card, pt was actually due for a CPE in May, however due to other circumstances she was unable to schedule her appt. Wants to know if Dr.Paz can fit her in, can use 2 slots.

## 2015-03-01 ENCOUNTER — Emergency Department (HOSPITAL_BASED_OUTPATIENT_CLINIC_OR_DEPARTMENT_OTHER)
Admission: EM | Admit: 2015-03-01 | Discharge: 2015-03-01 | Disposition: A | Discharge status: Home / Self Care | Payer: 59 | Attending: Emergency Medicine | Admitting: Emergency Medicine

## 2015-03-01 ENCOUNTER — Encounter (HOSPITAL_BASED_OUTPATIENT_CLINIC_OR_DEPARTMENT_OTHER): Payer: Self-pay | Admitting: *Deleted

## 2015-03-01 DIAGNOSIS — R21 Rash and other nonspecific skin eruption: Secondary | ICD-10-CM | POA: Diagnosis present

## 2015-03-01 DIAGNOSIS — L259 Unspecified contact dermatitis, unspecified cause: Secondary | ICD-10-CM | POA: Diagnosis not present

## 2015-03-01 DIAGNOSIS — Z87828 Personal history of other (healed) physical injury and trauma: Secondary | ICD-10-CM | POA: Diagnosis not present

## 2015-03-01 DIAGNOSIS — Z8639 Personal history of other endocrine, nutritional and metabolic disease: Secondary | ICD-10-CM | POA: Diagnosis not present

## 2015-03-01 NOTE — ED Notes (Signed)
Blistery rash on her left wrist after working in the garden 4 days ago.

## 2015-03-01 NOTE — Discharge Instructions (Signed)
Take the prednisone as prescribed. You can take Benadryl 50 mg every 6 hours as needed for itch. No driving while taking Benadryl as it can cause drowsiness. Keep your scheduled appointment with Dr.Paz on 03/10/2015. Ask him to recheck your blood pressure. Today's was elevated at 193/97

## 2015-03-01 NOTE — ED Provider Notes (Addendum)
CSN: [id number]     Arrival date & time 03/01/15  1202 History   First MD Initiated Contact with Patient 03/01/15 1216     Chief Complaint  Patient presents with  . Rash     (Consider location/radiation/quality/duration/timing/severity/associated sxs/prior Treatment) HPI Patient with diffuse pruritic rash over trunk and extremities onset after working in garden. She is treated self with Benadryl, with improvement of rash everywhere except on her left wrist. She continues to have pruritic, blistering rash on left wrist. Rash is nonpainful. No other associated symptoms. Past Medical History  Diagnosis Date  . Hyperlipidemia     h/o myalgias w/ crestor  . Back injury     2007   Past Surgical History  Procedure Laterality Date  . Abdominal hysterectomy  1989    Hyst and oophorectomy  . Hemorrhoidectomy with hemorrhoid banding  2010   Family History  Problem Relation Age of Onset  . Kidney disease Brother   . Hyperlipidemia Maternal Grandmother   . Stroke Maternal Grandmother   . Hypertension Maternal Grandmother   . Hyperlipidemia Maternal Grandfather   . Stroke Maternal Grandfather   . Hypertension Maternal Grandfather   . Hyperlipidemia Paternal Grandmother   . Stroke Paternal Grandmother   . Hypertension Paternal Grandmother   . Hyperlipidemia Paternal Grandfather   . Stroke Paternal Grandfather   . Hypertension Paternal Grandfather   . Colon cancer Neg Hx   . Breast cancer Neg Hx    Social History  Substance Use Topics  . Smoking status: Never Smoker   . Smokeless tobacco: Never Used  . Alcohol Use: No   OB History    No data available     Review of Systems  Constitutional: Negative.   HENT: Negative.   Respiratory: Negative.   Cardiovascular: Negative.   Gastrointestinal: Negative.   Musculoskeletal: Negative.   Skin: Positive for rash.  Neurological: Negative.   Psychiatric/Behavioral: Negative.   All other systems reviewed and are  negative.     Allergies  Other  Home Medications   Prior to Admission medications   Medication Sig Start Date End Date Taking? Authorizing Provider  acetaminophen (TYLENOL) 500 MG tablet Take 1 tablet (500 mg total) by mouth every 6 (six) hours as needed for pain. 05/19/12   Tiffany Neva Seat, PA-C   BP 205/111 mmHg  Pulse 89  Temp(Src) 97.7 F (36.5 C) (Oral)  Resp 20  Ht  (1.651 m)  Wt 173 lb (78.472 kg)  BMI 28.79 kg/m2  SpO2 99% Physical Exam  Constitutional: She appears well-developed and well-nourished. No distress.  HENT:  Head: Normocephalic and atraumatic.  No mucosal lesion  Eyes: Conjunctivae are normal. Pupils are equal, round, and reactive to light.  Neck: Neck supple. No tracheal deviation present. No thyromegaly present.  Cardiovascular: Normal rate and regular rhythm.   No murmur heard. Pulmonary/Chest: Effort normal and breath sounds normal.  Abdominal: Soft. Bowel sounds are normal. She exhibits no distension. There is no tenderness.  Musculoskeletal: Normal range of motion. She exhibits no edema or tenderness.  Neurological: She is alert. Coordination normal.  Skin: Skin is warm and dry. Rash noted.  Clear fluid filled blisters in circumferential fashion at left wrist. There are scant hive-like lesions on trunk and all 4 extremities.  Psychiatric: She has a normal mood and affect.  Nursing note and vitals reviewed.   ED Course  Procedures (including critical care time) Labs Review Labs Reviewed - No data to display  Imaging Review No results  found. I, Bladyn Tipps, personally reviewed and evaluated these images and lab results as part of my medical decision-making.   EKG Interpretation None      MDM   rash is nonpainful. I do not suspect shingles. Likely contact dermatitis with onset after patient worked in garden. Plan prescription prednisone taper 60-60-50-50-40-40-30-30-20-20-10-10. Benadryl 50 mg every 6 hours when necessary itch.  Follow-up Dr.Paz. Blood pressure recheck Diagnoses #1 rash #2 elevated blood pressure Final diagnoses:  None        Doug Sou, MD 03/01/15 1255  Doug Sou, MD 03/01/15 1301

## 2015-03-03 ENCOUNTER — Ambulatory Visit
Admission: RE | Admit: 2015-03-03 | Discharge: 2015-03-03 | Disposition: A | Discharge status: Home / Self Care | Payer: 59 | Source: Ambulatory Visit | Attending: Internal Medicine | Admitting: Internal Medicine

## 2015-03-03 DIAGNOSIS — Z1231 Encounter for screening mammogram for malignant neoplasm of breast: Secondary | ICD-10-CM

## 2015-03-09 ENCOUNTER — Telehealth: Payer: Self-pay | Admitting: Behavioral Health

## 2015-03-09 NOTE — Telephone Encounter (Signed)
Patient declined completing Pre-Visit call. She addressed some concern about her appointment time and  having labs completed and voiced that she had spoke with someone earlier about being scheduled for the  morning instead. Writer unaware of whom the patient spoke with, but confirmed that the time of the  appointment is 2:00 PM. No further questions or concerns at the end of call.

## 2015-03-10 ENCOUNTER — Encounter: Payer: Self-pay | Admitting: Internal Medicine

## 2015-03-10 ENCOUNTER — Ambulatory Visit (INDEPENDENT_AMBULATORY_CARE_PROVIDER_SITE_OTHER): Payer: 59 | Admitting: Internal Medicine

## 2015-03-10 VITALS — BP 122/76 | HR 72 | Temp 97.6°F | Ht 65.0 in | Wt 174.4 lb

## 2015-03-10 DIAGNOSIS — R7303 Prediabetes: Secondary | ICD-10-CM

## 2015-03-10 DIAGNOSIS — R739 Hyperglycemia, unspecified: Secondary | ICD-10-CM | POA: Insufficient documentation

## 2015-03-10 DIAGNOSIS — Z0000 Encounter for general adult medical examination without abnormal findings: Secondary | ICD-10-CM

## 2015-03-10 DIAGNOSIS — R7989 Other specified abnormal findings of blood chemistry: Secondary | ICD-10-CM

## 2015-03-10 DIAGNOSIS — R7309 Other abnormal glucose: Secondary | ICD-10-CM

## 2015-03-10 DIAGNOSIS — J3089 Other allergic rhinitis: Secondary | ICD-10-CM

## 2015-03-10 NOTE — Progress Notes (Signed)
Pre visit review using our clinic review tool, if applicable. No additional management support is needed unless otherwise documented below in the visit note. 

## 2015-03-10 NOTE — Patient Instructions (Signed)
Get your blood work before you leave   Take OTC vitamin D approximately 1000 units daily   Check the  blood pressure   monthly   Be sure your blood pressure is between 110/65 and  145/85.  if it is consistently higher or lower, let me know    All about diabetes, great resource!  http://www.molina.com/.html

## 2015-03-10 NOTE — Assessment & Plan Note (Addendum)
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 Dr. Bosie Clos  [facility]  [facility] visit w/ gyn 11/13/2011, Pap smear negative, HPV negative. Dr Ernestina Penna. H/o hysteroctomy, no malignancy [facility] 02-2015 neg, breast exam normal today.  Diet-exercise discussed

## 2015-03-10 NOTE — Progress Notes (Signed)
Subjective:    Patient ID: Anne Ramirez, female    DOB: [birth year], 59 y.o.   MRN: [id number]  DOS:  03/10/2015 Type of visit - description : Physical exam Interval history: Concern about allergies: Went to the ER few days ago with a rash, particularly severe at the left wrist, was itchy but no painful, felt to be due to contact dermatitis. At that time did not have any lip or tongue swelling, no wheezing. Was prescribed prednisone and quickly improved. Her BP of the time was in the 200s. Few months ago, had a wasp sting, developed some red areas on the abdomen and her BP was also elevated.  Quite concerned about a rash, request an allergy referral, testing?.    Review of Systems  Constitutional: No fever. No chills. No unexplained wt changes. No unusual sweats  HEENT: No dental problems, no ear discharge, no facial swelling, no voice changes. No eye discharge, no eye  redness , no  intolerance to light   Respiratory: No wheezing , no  difficulty breathing. No cough , no mucus production  Cardiovascular: No CP, no leg swelling , no  Palpitations  GI: no nausea, no vomiting, no diarrhea , no  abdominal pain.  No blood in the stools. No dysphagia, no odynophagia    Endocrine: No polyphagia, no polyuria , no polydipsia  GU: No dysuria, gross hematuria, difficulty urinating. No urinary urgency, no frequency.  Musculoskeletal: No joint swellings or unusual aches or pains; occasionally hand sewn stiff, decrease with hand use Skin: See history of present illness  Allergic, immunologic: se HPI  Neurological: No dizziness no  syncope. No headaches. No diplopia, no slurred, no slurred speech, no motor deficits, no facial  Numbness  Hematological: No enlarged lymph nodes, no easy bruising , no unusual bleedings  Psychiatry: No suicidal ideas, no hallucinations, no beavior problems, no confusion.  No unusual/severe anxiety, no depression   Past Medical History  Diagnosis Date  .  Hyperlipidemia     h/o myalgias w/ crestor  . Back injury     2007    Past Surgical History  Procedure Laterality Date  . Abdominal hysterectomy  1989    Hyst and oophorectomy  . Hemorrhoidectomy with hemorrhoid banding  2010    Social History   Social History  . Marital Status: Unknown    Spouse Name: N/A  . Number of Children: 2  . Years of Education: N/A   Occupational History  . RN    Social History Main Topics  . Smoking status: Never Smoker   . Smokeless tobacco: Never Used  . Alcohol Use: No  . Drug Use: No  . Sexual Activity: Not on file   Other Topics Concern  . 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  Always use your most recent med list.               acetaminophen 500 MG tablet  Commonly known as:  TYLENOL  Take 1 tablet (500 mg total) by mouth every 6 (six) hours  as needed for pain.     predniSONE 10 MG tablet  Commonly known as:  DELTASONE  Take 10 mg by mouth daily with breakfast. As directed.           Objective:   Physical Exam BP 122/76 mmHg  Pulse 72  Temp(Src) 97.6 F (36.4 C) (Oral)  Ht  (1.651 m)  Wt 174 lb 6 oz (79.096 kg)  BMI 29.02 kg/m2  SpO2 98% General:   Well developed, well nourished . NAD.  Neck:   No  thyromegaly , lymphadenopathieshalic . Face symmetric, atraumatic Breast: no dominant mass, skin and nipples normal to inspection on palpation, axillary areas without mass or lymphadenopathy  Lungs:  CTA B Normal respiratory effort, no intercostal retractions, no accessory muscle use. Heart: RRR,  no murmur.  No pretibial edema bilaterally  Abdomen:  Not distended, soft, non-tender.  No rebound or rigidity.   Skin: Exposed areas without rash. Not pale. Not jaundice. Some hyperpigmentation and the area blisters, left arm. MSK: Hands and wrists without synovitis  Neurologic:  alert & oriented X3.  Speech normal, gait appropriate for age and unassisted Strength symmetric and appropriate for age.  Psych: Cognition and judgment appear intact.  Cooperative with normal attention span and concentration.  Behavior appropriate. No anxious or depressed appearing.    Assessment & Plan:   Allergies: Concern about to recent rash episodes associated with increased blood pressure, request a referral to an allergist,  will do.  Elevated BP: BP elevated only when she is under stress (like a ER visit, when worried about a rash) . Recommend monitoring.  Mild hyperglycemia: Recheck the A1c  hyperlipidemia: Recheck a FLP  History of low vitamin D: Recheck labs, recommend a vitamin D

## 2015-03-11 LAB — COMPREHENSIVE METABOLIC PANEL
ALBUMIN: 4.3 g/dL (ref 3.5–5.2)
ALK PHOS: 62 U/L (ref 39–117)
ALT: 15 U/L (ref 0–35)
AST: 12 U/L (ref 0–37)
BUN: 14 mg/dL (ref 6–23)
CHLORIDE: 100 meq/L (ref 96–112)
CO2: 33 mEq/L — ABNORMAL HIGH (ref 19–32)
Calcium: 9.1 mg/dL (ref 8.4–10.5)
Creatinine, Ser: 0.86 mg/dL (ref 0.40–1.20)
GFR: 86.88 mL/min (ref >60.00)
Glucose, Bld: 82 mg/dL (ref 70–99)
POTASSIUM: 3.5 meq/L (ref 3.5–5.1)
Sodium: 139 mEq/L (ref 135–145)
TOTAL PROTEIN: 7 g/dL (ref 6.0–8.3)
Total Bilirubin: 0.5 mg/dL (ref 0.2–1.2)

## 2015-03-11 LAB — LIPID PANEL
CHOL/HDL RATIO: 3
CHOLESTEROL: 246 mg/dL — AB (ref 0–200)
HDL: 71 mg/dL (ref >39.00)
NonHDL: 174.5
TRIGLYCERIDES: 241 mg/dL — AB (ref 0.0–149.0)
VLDL: 48.2 mg/dL — AB (ref 0.0–40.0)

## 2015-03-11 LAB — HEPATITIS C ANTIBODY: HCV Ab: NEGATIVE

## 2015-03-11 LAB — HEMOGLOBIN A1C: HEMOGLOBIN A1C: 6.1 % (ref 4.6–6.5)

## 2015-03-11 LAB — VITAMIN D 25 HYDROXY (VIT D DEFICIENCY, FRACTURES): VITD: 18.2 ng/mL — AB (ref 30.00–100.00)

## 2015-03-11 LAB — HIV ANTIBODY (ROUTINE TESTING W REFLEX): HIV 1&2 Ab, 4th Generation: NONREACTIVE

## 2015-03-11 LAB — LDL CHOLESTEROL, DIRECT: LDL DIRECT: 130 mg/dL

## 2015-03-15 MED ORDER — VITAMIN D (ERGOCALCIFEROL) 1.25 MG (50000 UNIT) PO CAPS: 50000.0000 [IU] | ORAL_CAPSULE | ORAL | Status: DC

## 2015-03-15 NOTE — Addendum Note (Signed)
Addended by: Dorette Grate on: 03/15/2015 02:35 PM   Modules accepted: Orders

## 2015-03-17 ENCOUNTER — Telehealth: Payer: Self-pay | Admitting: Internal Medicine

## 2015-03-17 ENCOUNTER — Ambulatory Visit (INDEPENDENT_AMBULATORY_CARE_PROVIDER_SITE_OTHER): Payer: 59 | Admitting: Physician Assistant

## 2015-03-17 ENCOUNTER — Encounter: Payer: Self-pay | Admitting: Physician Assistant

## 2015-03-17 VITALS — BP 198/106 | HR 74 | Temp 98.2°F | Resp 20 | Wt 174.0 lb

## 2015-03-17 DIAGNOSIS — I10 Essential (primary) hypertension: Secondary | ICD-10-CM

## 2015-03-17 MED ORDER — HYDROCHLOROTHIAZIDE 25 MG PO TABS: 25.0000 mg | ORAL_TABLET | Freq: Every day | ORAL | Status: DC

## 2015-03-17 NOTE — Telephone Encounter (Signed)
Patient is seen today.

## 2015-03-17 NOTE — Progress Notes (Signed)
Patient presents to clinic today as a walk-in complaining of feeling jittery and having a high BP noted at the eye doctor's office. Was triaged by RN with BP noted at 198/106. Patient has diagnosis of elevated BP in chart but is not currently on any medications.  BP Readings from Last 3 Encounters:  03/17/15 198/106  03/10/15 122/76  03/01/15 193/97   Patient denies chest pain, SOB, headaches or palpitations. Notes sweating. Endorses home BP measurements as follows:  03/17/15 8:24am 167/101 03/13/15 7:44am 197/87 03/13/15 7:44am 212/110  Was previously on steroid taper for contact dermatitis but has been off of medication for 5 days.  Past Medical History  Diagnosis Date  . Hyperlipidemia     h/o myalgias w/ crestor  . Back injury     2007    Current Outpatient Prescriptions on File Prior to Visit  Medication Sig Dispense Refill  . acetaminophen (TYLENOL) 500 MG tablet Take 1 tablet (500 mg total) by mouth every 6 (six) hours as needed for pain. 30 tablet 0  . Vitamin D, Ergocalciferol, (DRISDOL) 50000 UNITS CAPS capsule Take 1 capsule (50,000 Units total) by mouth every 7 (seven) days. For 3 months. 12 capsule 0   No current facility-administered medications on file prior to visit.    Allergies  Allergen Reactions  . Other Swelling    Sudden temperature change    Family History  Problem Relation Age of Onset  . Kidney disease Brother   . Hyperlipidemia Maternal Grandmother   . Stroke Maternal Grandmother   . Hypertension Maternal Grandmother   . Hyperlipidemia Maternal Grandfather   . Stroke Maternal Grandfather   . Hypertension Maternal Grandfather   . Hyperlipidemia Paternal Grandmother   . Stroke Paternal Grandmother   . Hypertension Paternal Grandmother   . Hyperlipidemia Paternal Grandfather   . Stroke Paternal Grandfather   . Hypertension Paternal Grandfather   . Colon cancer Neg Hx   . Breast cancer Neg Hx     Social History   Social History  .  Marital Status: Unknown    Spouse Name: N/A  . Number of Children: 2  . Years of Education: N/A   Occupational History  . RN    Social History Main Topics  . Smoking status: Never Smoker   . Smokeless tobacco: Never Used  . Alcohol Use: No  . Drug Use: No  . Sexual Activity: Not on file   Other Topics Concern  . 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  All other ROS are negative.  BP 198/106 mmHg  Pulse 74  Temp(Src) 98.2 F (36.8 C) (Oral)  Resp 20  Wt 174 lb (78.926 kg)  SpO2 100%  Physical Exam  Constitutional: She is oriented to person, place, and time and well-developed, well-nourished, and in no distress.  HENT:  Head: Normocephalic and atraumatic.  Eyes: Conjunctivae are normal.  Cardiovascular: Normal rate, regular rhythm, normal heart sounds and intact distal pulses.   Pulmonary/Chest: Effort normal and breath sounds normal. No respiratory distress. She has no wheezes. She has no rales. She exhibits no tenderness.  Neurological: She is alert and oriented to person, place, and time.  Skin: Skin is warm and dry.  No rash noted.  Psychiatric: Affect normal.  Vitals reviewed.   Recent Results (from the past 2160 hour(s))  Comprehensive metabolic panel     Status: Abnormal   Collection Time: 03/10/15  4:46 PM  Result Value Ref Range   Sodium 139 135 - 145 mEq/L   Potassium 3.5 3.5 - 5.1 mEq/L   Chloride 100 96 - 112 mEq/L   CO2 33 (H) 19 - 32 mEq/L   Glucose, Bld 82 70 - 99 mg/dL   BUN 14 6 - 23 mg/dL   Creatinine, Ser 1.61 0.40 - 1.20 mg/dL   Total Bilirubin 0.5 0.2 - 1.2 mg/dL   Alkaline Phosphatase 62 39 - 117 U/L   AST 12 0 - 37 U/L   ALT 15 0 - 35 U/L   Total Protein 7.0 6.0 - 8.3 g/dL   Albumin 4.3 3.5 - 5.2 g/dL   Calcium 9.1 8.4  - 09.6 mg/dL   GFR 04.54 >09.81 mL/min  Lipid panel     Status: Abnormal   Collection Time: 03/10/15  4:46 PM  Result Value Ref Range   Cholesterol 246 (H) 0 - 200 mg/dL    Comment: ATP III Classification       Desirable:  < 200 mg/dL               Borderline High:  200 - 239 mg/dL          High:  > = 191 mg/dL   Triglycerides 478.2 (H) 0.0 - 149.0 mg/dL    Comment: Normal:  <956 mg/dLBorderline High:  150 - 199 mg/dL   HDL 21.30 >86.57 mg/dL   VLDL 84.6 (H) 0.0 - 96.2 mg/dL   Total CHOL/HDL Ratio 3     Comment:                Men          Women1/2 Average Risk     3.4          3.3Average Risk          5.0          4.42X Average Risk          9.6          7.13X Average Risk          15.0          11.0                       NonHDL 174.50     Comment: NOTE:  Non-HDL goal should be 30 mg/dL higher than patient's LDL goal (i.e. LDL goal of < 70 mg/dL, would have non-HDL goal of < 100 mg/dL)  Hemoglobin X5M     Status: None   Collection Time: 03/10/15  4:46 PM  Result Value Ref Range   Hgb A1c MFr Bld 6.1 4.6 - 6.5 %    Comment: Glycemic Control Guidelines for People with Diabetes:Non Diabetic:  <6%Goal of Therapy: <7%Additional Action Suggested:  >8%   Vit D  25 hydroxy (rtn osteoporosis monitoring)     Status: Abnormal   Collection Time: 03/10/15  4:46 PM  Result Value Ref Range   VITD 18.20 (L) 30.00 - 100.00 ng/mL  HIV antibody     Status: None   Collection Time: 03/10/15  4:46 PM  Result Value Ref Range   HIV 1&2 Ab, 4th Generation NONREACTIVE NONREACTIVE    Comment:   HIV-1 antigen and HIV-1/HIV-2 antibodies  were not detected.  There is no laboratory evidence of HIV infection.   HIV-1/2 Antibody Diff        Not indicated. HIV-1 RNA, Qual TMA          Not indicated.     PLEASE NOTE: This information has been disclosed to you from records whose confidentiality may be protected by state law. If your state requires such protection, then the state law prohibits you from making any  further disclosure of the information without the specific written consent of the person to whom it pertains, or as otherwise permitted by law. A general authorization for the release of medical or other information is NOT sufficient for this purpose.   The performance of this assay has not been clinically validated in patients less than 36 years old.   For additional information please refer to http://education.quest[facility].com/faq/FAQ106.  (This link is being provided for informational/educational purposes only.)     Hepatitis C antibody     Status: None   Collection Time: 03/10/15  4:46 PM  Result Value Ref Range   HCV Ab NEGATIVE NEGATIVE  LDL cholesterol, direct     Status: None   Collection Time: 03/10/15  4:46 PM  Result Value Ref Range   Direct LDL 130.0 mg/dL    Comment: Optimal:  <454 mg/dLNear or Above Optimal:  100-129 mg/dLBorderline High:  130-159 mg/dLHigh:  160-189 mg/dLVery High:  >190 mg/dL    Assessment/Plan: Accelerated hypertension Home BP and BP in office at Stage II HTN. Asymptomatic. EKG unchanged from priors. Repeat BP at 178/98. Will Rx HCTZ 25 mg. Stay hydrated. Supportive measures reviewed. Keep BP log. Follow-up with PCP Tuesday. ER if anything worsens.

## 2015-03-17 NOTE — Progress Notes (Signed)
Pre visit review using our clinic review tool, if applicable. No additional management support is needed unless otherwise documented below in the visit note. 

## 2015-03-17 NOTE — Telephone Encounter (Signed)
Please advise 

## 2015-03-17 NOTE — Assessment & Plan Note (Signed)
Home BP and BP in office at Stage II HTN. Asymptomatic. EKG unchanged from priors. Repeat BP at 178/98. Will Rx HCTZ 25 mg. Stay hydrated. Supportive measures reviewed. Keep BP log. Follow-up with PCP Tuesday. ER if anything worsens.

## 2015-03-17 NOTE — Telephone Encounter (Signed)
Patient BP obtained: 198/106 with HR 74.  Patient states she feels jittery.  No headaches, SOB, chest pain or tightness.   Placed on schedule with Malva Cogan, PA at 10:00.

## 2015-03-17 NOTE — Telephone Encounter (Signed)
Relation to ZO:XWRU Call back number: [phone] Pharmacy:  Reason for call:  Patient wanted to inform PCP her BP reading for today 167/101. Patient stated she is not home and does not have a mobile #. Patient stated PCP advised he would prescribed Rx if BP was high, patient stated she is on her way to the office to pick up Rx. i advised patient PCP is in clinic and we will notify her regarding Rx. Patient was adamant about coming to office to pick up Rx

## 2015-03-17 NOTE — Patient Instructions (Signed)
Please go downstairs to pick up your medication. Take as soon as you get home and continue daily.  Limit salt intake but stay well hydrated.  Check BP daily and anytime you feel jittery.  Follow-up with Dr. Drue Novel on Monday.

## 2015-03-17 NOTE — Telephone Encounter (Signed)
error:[id number] ° °

## 2015-03-17 NOTE — Telephone Encounter (Signed)
Patient is currently in the office with the following readings and patient has jitters. Please advise   03/17/15 8:24am 167/101 03/13/15 7:44am 197/87 03/13/15 7:44am 212/110 03/10/15 6:38pm 122/76

## 2015-03-23 ENCOUNTER — Encounter: Payer: Self-pay | Admitting: Internal Medicine

## 2015-03-23 ENCOUNTER — Ambulatory Visit (INDEPENDENT_AMBULATORY_CARE_PROVIDER_SITE_OTHER): Payer: 59 | Admitting: Internal Medicine

## 2015-03-23 ENCOUNTER — Telehealth: Payer: Self-pay | Admitting: Internal Medicine

## 2015-03-23 VITALS — BP 142/94 | HR 85 | Temp 98.2°F | Ht 65.0 in | Wt 170.5 lb

## 2015-03-23 DIAGNOSIS — E876 Hypokalemia: Secondary | ICD-10-CM

## 2015-03-23 DIAGNOSIS — I10 Essential (primary) hypertension: Secondary | ICD-10-CM | POA: Diagnosis not present

## 2015-03-23 DIAGNOSIS — R7309 Other abnormal glucose: Secondary | ICD-10-CM | POA: Diagnosis not present

## 2015-03-23 DIAGNOSIS — Z09 Encounter for follow-up examination after completed treatment for conditions other than malignant neoplasm: Secondary | ICD-10-CM

## 2015-03-23 DIAGNOSIS — R7303 Prediabetes: Secondary | ICD-10-CM

## 2015-03-23 LAB — BASIC METABOLIC PANEL
BUN: 15 mg/dL (ref 6–23)
CHLORIDE: 99 meq/L (ref 96–112)
CO2: 34 mEq/L — ABNORMAL HIGH (ref 19–32)
Calcium: 10.2 mg/dL (ref 8.4–10.5)
Creatinine, Ser: 0.74 mg/dL (ref 0.40–1.20)
GFR: 103.32 mL/min (ref >60.00)
Glucose, Bld: 87 mg/dL (ref 70–99)
POTASSIUM: 3.3 meq/L — AB (ref 3.5–5.1)
Sodium: 140 mEq/L (ref 135–145)

## 2015-03-23 MED ORDER — VITAMIN D (ERGOCALCIFEROL) 1.25 MG (50000 UNIT) PO CAPS: 50000.0000 [IU] | ORAL_CAPSULE | ORAL | Status: DC

## 2015-03-23 MED ORDER — HYDROCHLOROTHIAZIDE 25 MG PO TABS: 25.0000 mg | ORAL_TABLET | Freq: Every day | ORAL | Status: DC

## 2015-03-23 NOTE — Telephone Encounter (Signed)
Relation to WU:JWJX Call back number:[phone] Pharmacy:  Reason for call:  Patient requesting a 3 month supply of Vitamin D, Ergocalciferol, (DRISDOL) 50000 UNITS CAPS capsule please send to  [id number], [place] - [address] [phone] (Phone) [phone] (Fax)

## 2015-03-23 NOTE — Addendum Note (Signed)
Addended by: Dorette Grate on: 03/23/2015 11:48 AM   Modules accepted: Orders

## 2015-03-23 NOTE — Progress Notes (Signed)
Pre visit review using our clinic review tool, if applicable. No additional management support is needed unless otherwise documented below in the visit note. 

## 2015-03-23 NOTE — Progress Notes (Signed)
Subjective:    Patient ID: Anne Ramirez, female    DOB: 05-10-1956, 59 y.o.   MRN: [id number]  DOS:  03/23/2015 Type of visit - description : Follow-up Interval history: was seen 03/17/2015, BP was very high, started hydrochlorothiazide, good compliance, no apparent side effects. History of prediabetes: dx was discussed with the patient.    Review of Systems  Denies chest pain or difficulty breathing Already trying to eat healthier. Denies leg cramps except for today she had some.  Past Medical History  Diagnosis Date  . Hyperlipidemia     h/o myalgias w/ crestor  . Back injury     2007    Past Surgical History  Procedure Laterality Date  . Abdominal hysterectomy  1989    Hyst and oophorectomy  . Hemorrhoidectomy with hemorrhoid banding  2010    Social History   Social History  . Marital Status: Unknown    Spouse Name: N/A  . Number of Children: 2  . Years of Education: N/A   Occupational History  . RN    Social History Main Topics  . Smoking status: Never Smoker   . Smokeless tobacco: Never Used  . Alcohol Use: No  . Drug Use: No  . Sexual Activity: Not on file   Other Topics Concern  . 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  Always use your most recent med list.               acetaminophen 500 MG tablet  Commonly known as:  TYLENOL  Take 1 tablet (500 mg total) by mouth every 6 (six) hours as needed for pain.     hydrochlorothiazide 25 MG tablet  Commonly known as:  HYDRODIURIL  Take 1 tablet (25 mg total) by mouth daily.     Vitamin D (Ergocalciferol) 50000 UNITS Caps capsule  Commonly known as:  DRISDOL  Take 1 capsule (50,000 Units total) by mouth every 7 (seven) days.  For 3 months.           Objective:   Physical Exam BP 142/94 mmHg  Pulse 85  Temp(Src) 98.2 F (36.8 C) (Oral)  Ht  (1.651 m)  Wt 170 lb 8 oz (77.338 kg)  BMI 28.37 kg/m2  SpO2 96% General:   Well developed, well nourished . NAD.  HEENT:  Normocephalic . Face symmetric, atraumatic Lungs:  CTA B Normal respiratory effort, no intercostal retractions, no accessory muscle use. Heart: RRR,  no murmur.  No pretibial edema bilaterally  Skin: Not pale. Not jaundice Neurologic:  alert & oriented X3.  Speech normal, gait appropriate for age and unassisted Psych--  Cognition and judgment appear intact.  Cooperative with normal attention span and concentration.  Behavior appropriate. No anxious or depressed appearing.      Assessment & Plan:  Problem list >  Prediabetes HTN -- started meds 02-2015 Hyperlipidemia ----  myalgias with Crestor Low Vit D dx 02-2015  Back injury 2007   A/P Hypertension: Recent BP elevated, now on hctz, BP better >>>> check a BMP, consider daily potassium supplements. Recommend to check BPs at  least 3 times a week, follow-up in 4 months, sooner if BP not at goal  BP Readings from Last 3 Encounters:  03/23/15 142/94  03/17/15 198/106  03/10/15 122/76   Low vitamin D: Prescription sent again today. Prediabetes: Diet discussed

## 2015-03-23 NOTE — Telephone Encounter (Signed)
E[facility]ol [facility] as requested.

## 2015-03-23 NOTE — Assessment & Plan Note (Signed)
Hypertension: Recent BP elevated, now on hctz, BP better >>>> check a BMP, consider daily potassium supplements. Recommend to check BPs at least 3 times a week, follow-up in 4 months, sooner if BP not at goal  BP Readings from Last 3 Encounters:  03/23/15 142/94  03/17/15 198/106  03/10/15 122/76   Low vitamin D: Prescription sent again today. Prediabetes: Diet discussed

## 2015-03-23 NOTE — Patient Instructions (Signed)
Get your blood work before you leave   Continue same medicines including the vitamin D  Check the  blood pressure 2 or 3 times a   Week Be sure your blood pressure is between 110/65 and  145/85.  if it is consistently higher or lower, let me know   Next visit  for a routine visit (15 min) in 4 months   Please schedule an appointment at the front desk No need to come back fasting

## 2015-03-29 MED ORDER — POTASSIUM CHLORIDE CRYS ER 10 MEQ PO TBCR: 10.0000 meq | EXTENDED_RELEASE_TABLET | Freq: Every day | ORAL | Status: DC

## 2015-03-29 NOTE — Addendum Note (Signed)
Addended by: Dorette Grate on: 03/29/2015 10:23 AM   Modules accepted: Orders

## 2015-07-05 ENCOUNTER — Other Ambulatory Visit: Payer: Self-pay | Admitting: Internal Medicine

## 2015-07-14 ENCOUNTER — Encounter: Payer: Self-pay | Admitting: Internal Medicine

## 2015-07-14 ENCOUNTER — Ambulatory Visit (INDEPENDENT_AMBULATORY_CARE_PROVIDER_SITE_OTHER): Payer: 59 | Admitting: Internal Medicine

## 2015-07-14 ENCOUNTER — Encounter (INDEPENDENT_AMBULATORY_CARE_PROVIDER_SITE_OTHER): Payer: Self-pay

## 2015-07-14 ENCOUNTER — Other Ambulatory Visit (INDEPENDENT_AMBULATORY_CARE_PROVIDER_SITE_OTHER): Payer: 59

## 2015-07-14 ENCOUNTER — Telehealth: Payer: Self-pay | Admitting: Internal Medicine

## 2015-07-14 VITALS — BP 134/70 | HR 76 | Ht 65.0 in | Wt 178.6 lb

## 2015-07-14 DIAGNOSIS — T7840XA Allergy, unspecified, initial encounter: Secondary | ICD-10-CM

## 2015-07-14 DIAGNOSIS — Z889 Allergy status to unspecified drugs, medicaments and biological substances status: Secondary | ICD-10-CM | POA: Diagnosis not present

## 2015-07-14 DIAGNOSIS — L502 Urticaria due to cold and heat: Secondary | ICD-10-CM | POA: Insufficient documentation

## 2015-07-14 LAB — CBC WITH DIFFERENTIAL/PLATELET
BASOS PCT: 0.8 % (ref 0.0–3.0)
Basophils Absolute: 0.1 10*3/uL (ref 0.0–0.1)
EOS PCT: 1.8 % (ref 0.0–5.0)
Eosinophils Absolute: 0.1 10*3/uL (ref 0.0–0.7)
HEMATOCRIT: 41.8 % (ref 36.0–46.0)
HEMOGLOBIN: 13.6 g/dL (ref 12.0–15.0)
Lymphocytes Relative: 56.3 % — ABNORMAL HIGH (ref 12.0–46.0)
Lymphs Abs: 3.6 10*3/uL (ref 0.7–4.0)
MCHC: 32.5 g/dL (ref 30.0–36.0)
MCV: 87.1 fl (ref 78.0–100.0)
MONO ABS: 0.5 10*3/uL (ref 0.1–1.0)
MONOS PCT: 7.5 % (ref 3.0–12.0)
Neutro Abs: 2.2 10*3/uL (ref 1.4–7.7)
Neutrophils Relative %: 33.6 % — ABNORMAL LOW (ref 43.0–77.0)
Platelets: 283 10*3/uL (ref 150.0–400.0)
RBC: 4.8 Mil/uL (ref 3.87–5.11)
RDW: 14.5 % (ref 11.5–15.5)
WBC: 6.4 10*3/uL (ref 4.0–10.5)

## 2015-07-14 LAB — SEDIMENTATION RATE: SED RATE: 15 mm/h (ref 0–22)

## 2015-07-14 LAB — C-REACTIVE PROTEIN: CRP: 0.2 mg/dL — AB (ref 0.5–20.0)

## 2015-07-14 NOTE — Assessment & Plan Note (Addendum)
It sounds as if she is describing a variety of isolated incidents happening at least several months apart. She has had periorbital edema, perhaps angioedema of feet and hands Plan- labs for allergy and inflammation assessment. Combine H1 and h2 antihistamines for maintenance trial over 2-3 months to see if this can prevent outbreaks.

## 2015-07-14 NOTE — Telephone Encounter (Signed)
Caller name: Self   Can be reached: [phone] Pharmacy:  [id number] [place], [place] - [address] [phone] ([phone] (Fax)         Reason for call: Request refill potassium chloride (K-DUR,KLOR-CON) 10 MEQ tablet [[id number]][[id number]]

## 2015-07-14 NOTE — Progress Notes (Signed)
07/14/15- 59 yoF Referred by Dr Drue Novel. Pt states breaking out in hives and blisters in August 2016 after gardening outside. Pt states ongoing allergy problems like swollen eyes, skin itching, and joint swelling since living in [place]  Describes an episode of swelling around the eyes and a swollen finger while in [place] in the 1980s. Later in [place] she noticed soles of feet were swollen. In [place] she had periorbital edema. She tried antihistamine for itching of eyes. These are all episodic and not clearly seasonal except that cold temperatures may be an important trigger. Temperature change causes hoarseness. Claritin has been some help. One episode of blebs and vesicles on her skin while blood pressure was very high. Picture she shows me suggest that she had ankle edema at that time recent emergency room visit for similar complaints with response to prednisone taper. Occasionally hands get really cold. Can't tell about blanching or discoloration to suggest Raynaud's phenomena. She has not been on ACE inhibitors. Denies history of asthma, seasonal pollen allergic rhinitis or food allergies. Family-son has persistent dermatitis and others have asthma and rhinitis. Has had BCG vaccine  Prior to Admission medications   Medication Sig Start Date End Date Taking? Authorizing Provider  acetaminophen (TYLENOL) 500 MG tablet Take 1 tablet (500 mg total) by mouth every 6 (six) hours as needed for pain. 05/19/12  Yes Tiffany Neva Seat, PA-C  hydrochlorothiazide (HYDRODIURIL) 25 MG tablet Take 1 tablet (25 mg total) by mouth daily. 03/23/15  Yes Wanda Plump, MD  loratadine (CLARITIN) 10 MG tablet Take 10 mg by mouth daily as needed for allergies.   Yes Historical Provider, MD  Vitamin D, Ergocalciferol, (DRISDOL) 50000 UNITS CAPS capsule Take 1 capsule (50,000 Units total) by mouth every 7 (seven) days. For 3 months. 03/23/15  Yes Wanda Plump, MD  potassium chloride (K-DUR,KLOR-CON) 10 MEQ tablet Take 1 tablet (10 mEq  total) by mouth daily. Patient not taking: Reported on 07/14/2015 03/29/15   Wanda Plump, MD   Past Medical History  Diagnosis Date  . Hyperlipidemia     h/o myalgias w/ crestor  . Back injury     2007   Past Surgical History  Procedure Laterality Date  . Abdominal hysterectomy  1989    Hyst and oophorectomy  . Hemorrhoidectomy with hemorrhoid banding  2010   Family History  Problem Relation Age of Onset  . Kidney disease Brother   . Hyperlipidemia Maternal Grandmother   . Stroke Maternal Grandmother   . Hypertension Maternal Grandmother   . Hyperlipidemia Maternal Grandfather   . Stroke Maternal Grandfather   . Hypertension Maternal Grandfather   . Hyperlipidemia Paternal Grandmother   . Stroke Paternal Grandmother   . Hypertension Paternal Grandmother   . Hyperlipidemia Paternal Grandfather   . Stroke Paternal Grandfather   . Hypertension Paternal Grandfather   . Colon cancer Neg Hx   . Breast cancer Neg Hx    Social History   Social History  . Marital Status: Unknown    Spouse Name: N/A  . Number of Children: 2  . Years of Education: N/A   Occupational History  . RN    Social History Main Topics  . Smoking status: Never Smoker   . Smokeless tobacco: Never Used  . Alcohol Use: No  . Drug Use: No  . Sexual Activity: Not on file   Other Topics Concern  . 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 HEENT:    headaches, difficulty swallowing, tooth/dental problems, sore throat,       sneezing, itching, ear ache, nasal congestion, post nasal drip, snoring CV:    chest pain, orthopnea, PND, swelling in lower extremities, anasarca,  dizziness, palpitations Resp:    shortness of breath with exertion or at rest.                productive cough,   non-productive cough, coughing up of blood.              change in color of mucus.  wheezing.   Skin:    rash or lesions. GI:  No-   heartburn, indigestion, abdominal pain, nausea, vomiting, diarrhea,                 change in bowel habits, loss of appetite GU: dysuria, change in color of urine, no urgency or frequency.   flank pain. MS:   joint pain, stiffness, decreased range of motion, back pain. Neuro-     nothing unusual Psych:  change in mood or affect.  depression or anxiety.   memory loss.  OBJ- Physical Exam General- Alert, Oriented, Affect-appropriate, Distress- none acute Skin- rash-none, lesions- none, excoriation- none Lymphadenopathy- none Head- atraumatic            Eyes- Gross vision intact, PERRLA, conjunctivae and secretions clear            Ears- Hearing, canals-normal            Nose- `, no-Septal dev, + mucus, polyps, erosion, perforation             Throat- Mallampati III-IV, mucosa clear , drainage- none, tonsils- atrophic,+ hoarse, torus Neck- flexible , trachea midline, no stridor , thyroid nl, carotid no bruit Chest - symmetrical excursion , unlabored           Heart/CV- RRR , no murmur , no gallop  , no rub, nl s1 s2                           - JVD- none , edema- none, stasis changes- none, varices- none           Lung- clear to P&A, wheeze- none, cough- none , dullness-none, rub- none           Chest wall-  Abd-  Br/ Gen/ Rectal- Not done, not indicated Extrem- cyanosis- none, clubbing, none, atrophy- none, strength- nl Neuro- grossly intact to observation

## 2015-07-14 NOTE — Patient Instructions (Signed)
Recommend daily maintenance for at least 2 months, while we watch to see if we can reduce the frequency of your rashes and itching: H1 antihistamine- Claritin or AllegraTake one of each class, once every day for at least 2 months  H2 antihistamine- Pepcid/ famotadine  Once daily before a meal  Order- lab; Allergy profile, Food IgE profile, Sed rate, ANA, C-reactive protein, Angioedema panel, CBC w diff

## 2015-07-14 NOTE — Telephone Encounter (Signed)
Notes Recorded by Dorette GrateKaylyn C Faulkner, CMA on 03/29/2015 at 10:27 AM Unable to contact Pt via telephone. Letter printed with recommendations and mailed to Pt, along with KCl 10 mEq prescription. Instructed Pt to call office at schedule lab appt to recheck potassium levels in 3 weeks. Also, instructed Pt to call if questions or concerns. Notes Recorded by Dorette GrateKaylyn C Faulkner, CMA on 03/25/2015 at 8:41 AM Tried calling Pt, no answer, no voicemail. Unable to leave message. Will try contact Pt again. Notes Recorded by Wanda PlumpJose E Paz, MD on 03/25/2015 at 8:33 AM Advise patient, potassium is slightly low. Start KCl 10 mEq one tablet daily #30 and one refill. Schedule a BMP in 3 weeks DX hypertension Likely will need ongoing potassium supplements.  Denise: Pt needs potassium checked before refills. Please schedule lab appt. Thank you.

## 2015-07-15 LAB — ALLERGY FULL PROFILE
Alternaria Alternata: <0.1 kU/L
Aspergillus fumigatus, m3: <0.1 kU/L
Common Ragweed: <0.1 kU/L
Elm IgE: <0.1 kU/L
G005 Rye, Perennial: <0.1 kU/L
Goldenrod: <0.1 kU/L
Helminthosporium halodes: <0.1 kU/L
House Dust Hollister: <0.1 kU/L
Lamb's Quarters: <0.1 kU/L
Plantain: <0.1 kU/L
Stemphylium Botryosum: <0.1 kU/L
Sycamore Tree: <0.1 kU/L
Timothy Grass: <0.1 kU/L

## 2015-07-15 LAB — ALLERGEN FOOD PROFILE SPECIFIC IGE
Chicken IgE: <0.1 kU/L
Corn: <0.1 kU/L
Egg White IgE: <0.1 kU/L
IGE (IMMUNOGLOBULIN E), SERUM: 76 kU/L (ref <115)
MILK IGE: 0.36 kU/L — AB
Orange: <0.1 kU/L
Peanut IgE: <0.1 kU/L
Shrimp IgE: 0.11 kU/L — ABNORMAL HIGH
Soybean IgE: <0.1 kU/L

## 2015-07-15 LAB — ANA: Anti Nuclear Antibody(ANA): NEGATIVE

## 2015-07-15 LAB — C4 COMPLEMENT: C4 COMPLEMENT: 26 mg/dL (ref 10–40)

## 2015-07-20 LAB — C1 ESTERASE INHIBITOR, FUNCTIONAL

## 2015-07-20 LAB — COMPLEMENT COMPONENT C1Q: Complement C1Q: 6.3 mg/dL (ref 5.0–8.6)

## 2015-07-21 ENCOUNTER — Encounter: Payer: Self-pay | Admitting: Internal Medicine

## 2015-07-21 ENCOUNTER — Ambulatory Visit (INDEPENDENT_AMBULATORY_CARE_PROVIDER_SITE_OTHER): Payer: BLUE CROSS/BLUE SHIELD | Admitting: Internal Medicine

## 2015-07-21 ENCOUNTER — Telehealth: Payer: Self-pay | Admitting: Internal Medicine

## 2015-07-21 VITALS — BP 132/74 | HR 77 | Temp 98.2°F | Ht 65.0 in | Wt 176.2 lb

## 2015-07-21 DIAGNOSIS — I10 Essential (primary) hypertension: Secondary | ICD-10-CM | POA: Diagnosis not present

## 2015-07-21 DIAGNOSIS — L853 Xerosis cutis: Secondary | ICD-10-CM | POA: Diagnosis not present

## 2015-07-21 LAB — C1 ESTERASE INHIBITOR: C1INH SerPl-mCnc: 35 mg/dL (ref 21–39)

## 2015-07-21 MED ORDER — POTASSIUM CHLORIDE CRYS ER 10 MEQ PO TBCR: 10.0000 meq | EXTENDED_RELEASE_TABLET | Freq: Every day | ORAL | Status: DC

## 2015-07-21 NOTE — Telephone Encounter (Signed)
If she thinks this is different from the rash she saw me about, then I would have her see dermatologist.  If this seems to be the same problem for which i saw her, then offer prednisone 10 mg, # 20, 4 X 2 DAYS, 3 X 2 DAYS, 2 X 2 DAYS, 1 X 2 DAYS.

## 2015-07-21 NOTE — Telephone Encounter (Signed)
Spoke with pt, pt saw Dr. Drue NovelPaz today for rov in which her skin was discussed-c/o "scaly skin" on feet.  Notes intermittent red blotches on b/l legs.  Dr. Drue NovelPaz recommended pt see dermatologist for this. Pt states the scaliness is on both of her arms as well-itches more on arms than on legs and feet.  Pt has put lotion, grapeseed oil, and vaseline on skin, which is not helping.  Pt has taken pepcid for 2 days, as well as otc antihistamine.    Pt requesting further recs from [facility].  Please advise.  Thanks!   Allergies  Allergen Reactions  . Other Swelling    Sudden temperature change   Current Outpatient Prescriptions on File Prior to Visit  Medication Sig Dispense Refill  . acetaminophen (TYLENOL) 500 MG tablet Take 1 tablet (500 mg total) by mouth every 6 (six) hours as needed for pain. (Patient not taking: Reported on 07/21/2015) 30 tablet 0  . cholecalciferol (VITAMIN D) 1000 units tablet Take 1,000 Units by mouth daily.    . hydrochlorothiazide (HYDRODIURIL) 25 MG tablet Take 1 tablet (25 mg total) by mouth daily. 30 tablet 2  . loratadine (CLARITIN) 10 MG tablet Take 10 mg by mouth daily.     . potassium chloride (K-DUR,KLOR-CON) 10 MEQ tablet Take 1 tablet (10 mEq total) by mouth daily. 30 tablet 1   No current facility-administered medications on file prior to visit.

## 2015-07-21 NOTE — Patient Instructions (Addendum)
BEFORE YOU LEAVE THE OFFICE:  GO TO THE FRONT DESK Schedule labs to be done in 2 weeks    Schedule a complete physical exam to be done by 02-2016   Please be fasting    AFTER YOU LEAVE THE OFFICE:  Take the medications as prescribed  Check the  blood pressure 2   times a month  Be sure your blood pressure is between 110/65 and  145/85. If it is consistently higher or lower, let me know

## 2015-07-21 NOTE — Progress Notes (Signed)
Pre visit review using our clinic review tool, if applicable. No additional management support is needed unless otherwise documented below in the visit note. 

## 2015-07-21 NOTE — Telephone Encounter (Signed)
Spoke with Anne Ramirez. She reports the rash is different than what she saw Dr. Maple HudsonYoung. I advised then he recs she see her dermatologists. Anne Ramirez began to start yelling on the phone (could not understand what she was saying) and then stated "thank you" and hung up. Will sign off.

## 2015-07-21 NOTE — Progress Notes (Signed)
Subjective:    Patient ID: Anne Ramirez, female    DOB: 06/08/1956, 60 y.o.   MRN: [id number]  DOS:  07/21/2015 Type of visit - description : Routine checkup Interval history: HTN: Good compliance with HCTZ, not taking potassium. Ambulatory BPs 120, occ in the 140s. Allergies: Saw Dr. Maple HudsonYoung, prescribed H1 and H2 blockers. Shortly after she started taking Pepcid developed a "rash" on the  legs. The patient showed the rash to me today and I really don't see much except a very dry skin at the lower extremities.   Review of Systems  Denies chest pain or difficulty breathing No recent lipid swelling or hives   Past Medical History  Diagnosis Date  . Hyperlipidemia     h/o myalgias w/ crestor  . Back injury     2007    Past Surgical History  Procedure Laterality Date  . Abdominal hysterectomy  1989    Hyst and oophorectomy  . Hemorrhoidectomy with hemorrhoid banding  2010    Social History   Social History  . Marital Status: Unknown    Spouse Name: N/A  . Number of Children: 2  . Years of Education: N/A   Occupational History  . RN    Social History Main Topics  . Smoking status: Never Smoker   . Smokeless tobacco: Never Used  . Alcohol Use: No  . Drug Use: No  . Sexual Activity: Not on file   Other Topics Concern  . 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  Always use your most recent med list.               acetaminophen 500 MG tablet  Commonly known as:  TYLENOL  Take 1 tablet (500 mg total) by mouth every 6 (six) hours as needed for pain.     cholecalciferol 1000 units tablet  Commonly known as:  VITAMIN D  Take 1,000 Units by mouth daily.     CLARITIN 10 MG tablet  Generic drug:   loratadine  Take 10 mg by mouth daily.     hydrochlorothiazide 25 MG tablet  Commonly known as:  HYDRODIURIL  Take 1 tablet (25 mg total) by mouth daily.     potassium chloride 10 MEQ tablet  Commonly known as:  K-DUR,KLOR-CON  Take 1 tablet (10 mEq total) by mouth daily.           Objective:   Physical Exam BP 132/74 mmHg  Pulse 77  Temp(Src) 98.2 F (36.8 C) (Oral)  Ht 5\' 5"  (1.651 m)  Wt 176 lb 4 oz (79.946 kg)  BMI 29.33 kg/m2  SpO2 98% General:   Well developed, well nourished . NAD.  HEENT:  Normocephalic . Face symmetric, atraumatic Lungs:  CTA B Normal respiratory effort, no intercostal retractions, no accessory muscle use. Heart: RRR,  no murmur.  No pretibial edema bilaterally  Skin: Skin at the lower extremities is dry but I don't see any actual rash. Some of the hair follicles are slightly hyperpigmented Neurologic:  alert & oriented X3.  Speech normal, gait appropriate for age and unassisted Psych--  Cognition and judgment appear intact.  Cooperative with normal attention span and concentration.  Behavior appropriate. No anxious or depressed appearing.      Assessment & Plan:   Assessment Prediabetes HTN -- started HCTZ 02-2015 Hyperlipidemia ----  myalgias with Crestor Low Vit D dx 02-2015  Back injury 2007  Allergic reactions: Saw Dr. Maple Hudson 06-2015 ; sx started before HCTZ  PLAN: HTN: Well-controlled with HCTZ ; last potassium was low. Plan: Continue diuretics, daily potassium, BMP in 2 weeks. Allergies: Saw Dr. Maple Hudson, was recommended H1 and H2 blockers however she has developed a "rash". I actually don't see a rash on exam, she has dry skin (an ? of  Keratosis pilaris) however she is extremely disturbed by this finding. Request a referral, will send to dermatology RTC 02-2016, CPX

## 2015-07-27 NOTE — Progress Notes (Signed)
Quick Note:  Called and spoke with pt. And discussed labs. Pt. Understood. Nothing further needed at this time. ______ 

## 2015-08-02 ENCOUNTER — Other Ambulatory Visit: Payer: BLUE CROSS/BLUE SHIELD

## 2015-08-03 ENCOUNTER — Other Ambulatory Visit (INDEPENDENT_AMBULATORY_CARE_PROVIDER_SITE_OTHER): Payer: BLUE CROSS/BLUE SHIELD

## 2015-08-03 DIAGNOSIS — I10 Essential (primary) hypertension: Secondary | ICD-10-CM | POA: Diagnosis not present

## 2015-08-03 LAB — BASIC METABOLIC PANEL
BUN: 13 mg/dL (ref 6–23)
CHLORIDE: 102 meq/L (ref 96–112)
CO2: 35 mEq/L — ABNORMAL HIGH (ref 19–32)
CREATININE: 0.8 mg/dL (ref 0.40–1.20)
Calcium: 9.1 mg/dL (ref 8.4–10.5)
GFR: 94.32 mL/min (ref >60.00)
GLUCOSE: 105 mg/dL — AB (ref 70–99)
POTASSIUM: 3.1 meq/L — AB (ref 3.5–5.1)
Sodium: 142 mEq/L (ref 135–145)

## 2015-08-05 ENCOUNTER — Other Ambulatory Visit: Payer: Self-pay

## 2015-08-05 MED ORDER — POTASSIUM CHLORIDE CRYS ER 10 MEQ PO TBCR: 20.0000 meq | EXTENDED_RELEASE_TABLET | Freq: Every day | ORAL | Status: DC

## 2015-08-05 NOTE — Addendum Note (Signed)
Addended byConrad Suissevale D on: 08/05/2015 04:19 PM   Modules accepted: Orders

## 2015-08-26 ENCOUNTER — Telehealth: Payer: Self-pay | Admitting: Internal Medicine

## 2015-08-26 NOTE — Telephone Encounter (Signed)
Annice Pih, please call the patient, needs a BMP -- dx  HTN. Please arrange.  I think the future order is already in, if not ask Kaylyn.

## 2015-08-30 ENCOUNTER — Other Ambulatory Visit (INDEPENDENT_AMBULATORY_CARE_PROVIDER_SITE_OTHER): Payer: BLUE CROSS/BLUE SHIELD

## 2015-08-30 DIAGNOSIS — I10 Essential (primary) hypertension: Secondary | ICD-10-CM | POA: Diagnosis not present

## 2015-08-30 LAB — BASIC METABOLIC PANEL
BUN: 12 mg/dL (ref 6–23)
CHLORIDE: 102 meq/L (ref 96–112)
CO2: 34 meq/L — AB (ref 19–32)
Calcium: 9.5 mg/dL (ref 8.4–10.5)
Creatinine, Ser: 0.86 mg/dL (ref 0.40–1.20)
GFR: 86.74 mL/min (ref >60.00)
Glucose, Bld: 112 mg/dL — ABNORMAL HIGH (ref 70–99)
POTASSIUM: 3.4 meq/L — AB (ref 3.5–5.1)
SODIUM: 141 meq/L (ref 135–145)

## 2015-09-01 MED ORDER — HYDROCHLOROTHIAZIDE 25 MG PO TABS: 25.0000 mg | ORAL_TABLET | Freq: Every day | ORAL | Status: DC

## 2015-09-01 MED ORDER — POTASSIUM CHLORIDE CRYS ER 10 MEQ PO TBCR: 20.0000 meq | EXTENDED_RELEASE_TABLET | Freq: Every day | ORAL | Status: DC

## 2015-09-01 NOTE — Addendum Note (Signed)
Addended byConrad Pitkin D on: 09/01/2015 08:03 AM   Modules accepted: Orders

## 2015-09-14 ENCOUNTER — Ambulatory Visit: Payer: Self-pay | Admitting: Internal Medicine

## 2015-09-15 ENCOUNTER — Ambulatory Visit: Payer: Self-pay | Admitting: Internal Medicine

## 2015-09-16 ENCOUNTER — Telehealth: Payer: Self-pay | Admitting: Internal Medicine

## 2015-09-16 NOTE — Telephone Encounter (Signed)
Please advise 

## 2015-09-16 NOTE — Telephone Encounter (Signed)
Relation to AV:WUJW Call back number:[phone]   Reason for call:  Patient states she would like to discuss her potassium results, postssium medication and she's been expercicing leg spasm patient in need of clinical advice. Patient would like to speak with nurse directly.

## 2015-09-17 NOTE — Telephone Encounter (Signed)
Called twice.  No answer.  Unable to leave a voice message.  Will try again.

## 2015-09-17 NOTE — Telephone Encounter (Signed)
Please contact the patient, she takes a diuretic, her potassium is essentially normal with supplementation. One thing she could do is take a OTC magnesium supplements.

## 2015-09-17 NOTE — Telephone Encounter (Addendum)
Called patient and discussed provider's note below.  She says that she feels much better today.  She understands and agrees with plan.   Plans to go to store and purchase OTC magnesium as suggested.

## 2015-10-14 ENCOUNTER — Other Ambulatory Visit: Payer: Self-pay

## 2015-10-14 DIAGNOSIS — Z1231 Encounter for screening mammogram for malignant neoplasm of breast: Secondary | ICD-10-CM

## 2015-11-15 ENCOUNTER — Encounter: Payer: Self-pay | Admitting: Internal Medicine

## 2015-11-15 ENCOUNTER — Ambulatory Visit (INDEPENDENT_AMBULATORY_CARE_PROVIDER_SITE_OTHER): Payer: BLUE CROSS/BLUE SHIELD | Admitting: Internal Medicine

## 2015-11-15 VITALS — BP 120/84 | HR 76 | Ht 65.0 in | Wt 180.2 lb

## 2015-11-15 DIAGNOSIS — L509 Urticaria, unspecified: Secondary | ICD-10-CM

## 2015-11-15 MED ORDER — METHYLPREDNISOLONE ACETATE 80 MG/ML IJ SUSP
80.0000 mg | Freq: Once | INTRAMUSCULAR | Status: AC
  Administered 2015-11-15: 80 mg via INTRAMUSCULAR

## 2015-11-15 MED ORDER — PREDNISONE 10 MG PO TABS: ORAL_TABLET | ORAL | Status: DC

## 2015-11-15 NOTE — Patient Instructions (Signed)
Depo 80    Dx urticaria  Script sent to try prednisone 10 mg tabs, one or 2 daily at onset of rash episodes, to see if a few days of prednisone can turn off this process.

## 2015-11-15 NOTE — Progress Notes (Signed)
07/14/15- 60 yoF Referred by Dr Drue NovelPaz. Pt states breaking out in hives and blisters in August 2016 after gardening outside. Pt states ongoing allergy problems like swollen eyes, skin itching, and joint swelling since living in [place]  Describes an episode of swelling around the eyes and a swollen finger while in [place] in the 1980s. Later in [place] she noticed soles of feet were swollen. In [place] she had periorbital edema. She tried antihistamine for itching of eyes. These are all episodic and not clearly seasonal except that cold temperatures may be an important trigger. Temperature change causes hoarseness. Claritin has been some help. One episode of blebs and vesicles on her skin while blood pressure was very high. Picture she shows me suggest that she had ankle edema at that time recent emergency room visit for similar complaints with response to prednisone taper. Occasionally hands get really cold. Can't tell about blanching or discoloration to suggest Raynaud's phenomena. She has not been on ACE inhibitors. Denies history of asthma, seasonal pollen allergic rhinitis or food allergies. Family-son has persistent dermatitis and others have asthma and rhinitis. Has had BCG vaccine  11/15/2015-60 year old female never smoker followed for urticaria,  problems with allergy.  feels like her head is going to bust.  she stated that she is having chills .  Allergy labs 07/14/2015 -Allergy Profile negative, flu profile mild elevations only for shrimp and milk with total IgE 76, sedimentation rate 15, ANA negative, CRP low 0.2, CBC with differential normal, angioedema panel negative. LFTs have been normal as of 02/2015.   ROS-see HPI   Negative unless "+" Constitutional:    weight loss, night sweats, fevers, chills, fatigue, lassitude. HEENT:    headaches, difficulty swallowing, tooth/dental problems, sore throat,       sneezing, itching, ear ache, nasal congestion, post nasal drip, snoring CV:    chest  pain, orthopnea, PND, swelling in lower extremities, anasarca,  dizziness, palpitations Resp:   shortness of breath with exertion or at rest.                productive cough,   non-productive cough, coughing up of blood.              change in color of mucus.  wheezing.   Skin:    rash or lesions. GI:  No-   heartburn, indigestion, abdominal pain, nausea, vomiting, diarrhea,                 change in bowel habits, loss of appetite GU: dysuria, change in color of urine, no urgency or frequency.   flank pain. MS:   joint pain, stiffness, decreased range of motion, back pain. Neuro-     nothing unusual Psych:  change in mood or affect.  depression or anxiety.   memory loss.  OBJ- Physical Exam General- Alert, Oriented, Affect-appropriate, Distress- none acute Skin- rash-none, lesions- none, excoriation- none Lymphadenopathy- none Head- atraumatic            Eyes- Gross vision intact, PERRLA, conjunctivae and secretions clear            Ears- Hearing, canals-normal            Nose- `, no-Septal dev, + mucus, polyps, erosion, perforation             Throat- Mallampati III-IV, mucosa clear , drainage- none, tonsils- atrophic,+ hoarse, torus Neck- flexible , trachea midline, no stridor , thyroid nl, carotid no bruit Chest - symmetrical excursion , unlabored  Heart/CV- RRR , no murmur , no gallop  , no rub, nl s1 s2                           - JVD- none , edema- none, stasis changes- none, varices- none           Lung- clear to P&A, wheeze- none, cough- none , dullness-none, rub- none           Chest wall-  Abd-  Br/ Gen/ Rectal- Not done, not indicated Extrem- cyanosis- none, clubbing, none, atrophy- none, strength- nl Neuro- grossly intact to observation

## 2016-03-07 ENCOUNTER — Ambulatory Visit
Admission: RE | Admit: 2016-03-07 | Discharge: 2016-03-07 | Disposition: A | Discharge status: Home / Self Care | Payer: BLUE CROSS/BLUE SHIELD | Source: Ambulatory Visit

## 2016-03-07 DIAGNOSIS — Z1231 Encounter for screening mammogram for malignant neoplasm of breast: Secondary | ICD-10-CM

## 2016-03-14 ENCOUNTER — Encounter: Payer: Self-pay | Admitting: Internal Medicine

## 2016-03-14 ENCOUNTER — Ambulatory Visit (INDEPENDENT_AMBULATORY_CARE_PROVIDER_SITE_OTHER): Payer: BLUE CROSS/BLUE SHIELD | Admitting: Internal Medicine

## 2016-03-14 ENCOUNTER — Ambulatory Visit (HOSPITAL_BASED_OUTPATIENT_CLINIC_OR_DEPARTMENT_OTHER)
Admission: RE | Admit: 2016-03-14 | Discharge: 2016-03-14 | Disposition: A | Discharge status: Home / Self Care | Payer: BLUE CROSS/BLUE SHIELD | Source: Ambulatory Visit | Attending: Internal Medicine | Admitting: Internal Medicine

## 2016-03-14 VITALS — BP 124/74 | HR 89 | Temp 98.7°F | Resp 12 | Ht 65.0 in | Wt 182.4 lb

## 2016-03-14 DIAGNOSIS — E559 Vitamin D deficiency, unspecified: Secondary | ICD-10-CM

## 2016-03-14 DIAGNOSIS — M19041 Primary osteoarthritis, right hand: Secondary | ICD-10-CM | POA: Insufficient documentation

## 2016-03-14 DIAGNOSIS — M19042 Primary osteoarthritis, left hand: Secondary | ICD-10-CM | POA: Diagnosis not present

## 2016-03-14 DIAGNOSIS — M79643 Pain in unspecified hand: Secondary | ICD-10-CM | POA: Diagnosis not present

## 2016-03-14 DIAGNOSIS — Z0000 Encounter for general adult medical examination without abnormal findings: Secondary | ICD-10-CM | POA: Diagnosis not present

## 2016-03-14 DIAGNOSIS — R739 Hyperglycemia, unspecified: Secondary | ICD-10-CM

## 2016-03-14 LAB — LIPID PANEL
CHOL/HDL RATIO: 5
Cholesterol: 248 mg/dL — ABNORMAL HIGH (ref 0–200)
HDL: 54.4 mg/dL (ref >39.00)
LDL Cholesterol: 162 mg/dL — ABNORMAL HIGH (ref 0–99)
NONHDL: 193.76
Triglycerides: 158 mg/dL — ABNORMAL HIGH (ref 0.0–149.0)
VLDL: 31.6 mg/dL (ref 0.0–40.0)

## 2016-03-14 LAB — HEMOGLOBIN A1C: Hgb A1c MFr Bld: 6 % (ref 4.6–6.5)

## 2016-03-14 LAB — BASIC METABOLIC PANEL
BUN: 13 mg/dL (ref 6–23)
CALCIUM: 10.4 mg/dL (ref 8.4–10.5)
CO2: 32 meq/L (ref 19–32)
Chloride: 100 mEq/L (ref 96–112)
Creatinine, Ser: 0.94 mg/dL (ref 0.40–1.20)
GFR: 78.14 mL/min (ref >60.00)
GLUCOSE: 110 mg/dL — AB (ref 70–99)
Potassium: 3.5 mEq/L (ref 3.5–5.1)
SODIUM: 139 meq/L (ref 135–145)

## 2016-03-14 LAB — SEDIMENTATION RATE: Sed Rate: 7 mm/hr (ref 0–30)

## 2016-03-14 MED ORDER — NYSTATIN-TRIAMCINOLONE 100000-0.1 UNIT/GM-% EX OINT: 1.0000 "application " | TOPICAL_OINTMENT | Freq: Two times a day (BID) | CUTANEOUS | 0 refills | Status: DC

## 2016-03-14 NOTE — Assessment & Plan Note (Addendum)
Tdap 2012 [facility] Colonoscopy 08/07/2008, no polyps; cscope again 2015, polyps, 5 years . Dr. Bosie ClosSchooler  [facility]  Pap smear negative, HPV negative 2013, no recent visits. Dr Ernestina PennaFogleman. H/o hysteroctomy, no malignancy Breast exam (-) 2016, [facility] 02-2016.  Diet-exercise discussed

## 2016-03-14 NOTE — Progress Notes (Signed)
Pre visit review using our clinic review tool, if applicable. No additional management support is needed unless otherwise documented below in the visit note. 

## 2016-03-14 NOTE — Patient Instructions (Signed)
GO TO THE LAB : Get the blood work     GO TO THE FRONT DESK Schedule your next appointment for a  routine checkup in 6 months   STOP BY THE FIRST FLOOR:  get the XR   Check the  blood pressure 2 or 3 times a month   Be sure your blood pressure is between 110/65 and  145/85. If it is consistently higher or lower, let me know   Use the cream twice a day, one is better, keep the area dry with powder.  Take OTC vitamin D 1000 units daily

## 2016-03-14 NOTE — Progress Notes (Signed)
Subjective:    Patient ID: Anne Ramirez, female    DOB: [birth year], 60 y.o.   MRN: [id number]  DOS:  03/14/2016 Type of visit - description : cpx Interval history:Has some concerns, see below    Review of Systems  Constitutional: No fever. No chills. No unexplained wt changes. No unusual sweats  HEENT: + Dental problems, follow-up by her dentist, no ear discharge, no facial swelling, no voice changes. No eye discharge, no eye  redness , no  intolerance to light   Respiratory: No wheezing , no  difficulty breathing. No cough , no mucus production  Cardiovascular: No CP, no palpitations, occasional leg swelling around the ankles at the end of the day. Minimal per pt  GI: no nausea, no vomiting, no diarrhea , no  abdominal pain.  No blood in the stools. No dysphagia, no odynophagia    Endocrine: No polyphagia, no polyuria , no polydipsia  GU: No dysuria, gross hematuria, difficulty urinating. No urinary urgency, no frequency.  Musculoskeletal: Reports pain at the small joints of the hands bilaterally. Area does not seem swollen, red or hot.  Skin: No change in the color of the skin, palor. Has an itchy rash below the breast bilaterally, better with triamcinolone? Patient not sure.  Allergic, immunologic: No environmental allergies , no  food allergies  Neurological: No dizziness no  syncope. No headaches. No diplopia, no slurred, no slurred speech, no motor deficits, no facial  Numbness  Hematological: No enlarged lymph nodes, no easy bruising , no unusual bleedings  Psychiatry: No suicidal ideas, no hallucinations, no beavior problems, no confusion.  No unusual/severe anxiety, no depression   Past Medical History:  Diagnosis Date  . Back injury    2007  . Hyperlipidemia    h/o myalgias w/ crestor    Past Surgical History:  Procedure Laterality Date  . ABDOMINAL HYSTERECTOMY  1989   Hyst and oophorectomy  . HEMORRHOIDECTOMY WITH HEMORRHOID BANDING  2010     Social History   Social History  . Marital status: Divorced    Spouse name: N/A  . Number of children: 2  . 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 Smoking status: Never Smoker  . Smokeless tobacco: Never Used  . Alcohol use No  . Drug use: No  . Sexual activity: Not on file   Other Topics Concern  . 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 Kidney disease Brother   . Hyperlipidemia Maternal Grandmother   . Stroke Maternal Grandmother   . Hypertension Maternal Grandmother   . Hyperlipidemia Maternal Grandfather   . Stroke Maternal Grandfather   . Hypertension Maternal Grandfather   . Hyperlipidemia Paternal Grandmother   . Stroke Paternal Grandmother   . Hypertension Paternal Grandmother   . Hyperlipidemia Paternal Grandfather   . Stroke Paternal Grandfather   . Hypertension Paternal Grandfather   . Colon cancer Neg Hx   . Breast cancer Neg Hx       Medication List       Accurate as of 03/14/16  5:18 PM. Always use your most recent med  list.          acetaminophen 500 MG tablet Commonly known as:  TYLENOL Take 1 tablet (500 mg total) by mouth every 6 (six) hours as needed for pain.   CLARITIN 10 MG tablet Generic drug:  loratadine Take 10 mg by mouth daily.   hydrochlorothiazide 25 MG tablet Commonly known as:  HYDRODIURIL Take 1 tablet (25 mg total) by mouth daily.   nystatin-triamcinolone ointment Commonly known as:  MYCOLOG Apply 1 application topically 2 (two) times daily.   potassium chloride 10 MEQ tablet Commonly known as:  K-DUR,KLOR-CON Take 2 tablets (20 mEq total) by mouth daily.          Objective:   Physical Exam BP 124/74 (BP Location:  Left Arm, Patient Position: Sitting, Cuff Size: Normal)   Pulse 89   Temp 98.7 F (37.1 C) (Oral)   Resp 12   Ht 5\' 5"  (1.651 m)   Wt 182 lb 6 oz (82.7 kg)   SpO2 96%   BMI 30.35 kg/m   General:   Well developed, well nourished . NAD.  Neck: No  thyromegaly  HEENT:  Normocephalic . Face symmetric, atraumatic Lungs:  CTA B Normal respiratory effort, no intercostal retractions, no accessory muscle use. Heart: RRR,  no murmur.  No pretibial edema bilaterally  Abdomen:  Not distended, soft, non-tender. No rebound or rigidity.   Skin:  Skin under breast w/ minimal hyperpigmentation mostly under L breast Neurologic:  alert & oriented X3.  Speech normal, gait appropriate for age and unassisted Strength symmetric and appropriate for age.  Psych: Cognition and judgment appear intact.  Cooperative with normal attention span and concentration.  Behavior appropriate. No anxious or depressed appearing.    Assessment & Plan:   Assessment Prediabetes HTN -- started HCTZ 02-2015 Hyperlipidemia ----  myalgias with Crestor Low Vit D dx 02-2015  Back injury 2007  Allergic reactions: Saw Dr. Maple HudsonYoung 06-2015 ; sx started before HCTZ  PLAN:   Prediabetes: Check A1c HTN: Check a BMP, continue HCTZ and potassium. Hyperlipidemia: Check labs Vitamin D: Recommend vitamin D daily, check labs Rash: Mycolog-II. Keep the area dry with powder once better Arthralgias, hand, likely DJD, no evidence of synovitis. Check a sedimentation rate;  X-ray of the hands to rule out erosions. RTC 6 months

## 2016-03-14 NOTE — Assessment & Plan Note (Signed)
Prediabetes: Check A1c HTN: Check a BMP, continue HCTZ and potassium. Hyperlipidemia: Check labs Vitamin D: Recommend vitamin D daily, check labs Rash: Mycolog-II. Keep the area dry with powder once better Arthralgias, hand, likely DJD, no evidence of synovitis. Check a sedimentation rate;  X-ray of the hands to rule out erosions. RTC 6 months

## 2016-03-18 LAB — VITAMIN D 1,25 DIHYDROXY
VITAMIN D 1, 25 (OH) TOTAL: 36 pg/mL (ref 18–72)
Vitamin D2 1, 25 (OH)2: 12 pg/mL
Vitamin D3 1, 25 (OH)2: 24 pg/mL

## 2016-03-21 ENCOUNTER — Telehealth: Payer: Self-pay | Admitting: Internal Medicine

## 2016-03-21 NOTE — Telephone Encounter (Signed)
Caller name: Relationship to patient: Self Can be [phone] Pharmacy:  Reason for call: Patient request call back from CMA to ask a question. Would not give additional information

## 2016-03-21 NOTE — Telephone Encounter (Signed)
Pt calling to discuss lab results. Pt informed of results, see lab notes.

## 2016-03-21 NOTE — Telephone Encounter (Signed)
Opened in error

## 2016-04-18 ENCOUNTER — Ambulatory Visit (INDEPENDENT_AMBULATORY_CARE_PROVIDER_SITE_OTHER): Payer: BLUE CROSS/BLUE SHIELD | Admitting: Internal Medicine

## 2016-04-18 ENCOUNTER — Encounter: Payer: Self-pay | Admitting: Internal Medicine

## 2016-04-18 DIAGNOSIS — L502 Urticaria due to cold and heat: Secondary | ICD-10-CM

## 2016-04-18 NOTE — Progress Notes (Signed)
07/14/15- 60 yoF Referred by Dr Drue Novel. Pt states breaking out in hives and blisters in August 2016 after gardening outside. Pt states ongoing allergy problems like swollen eyes, skin itching, and joint swelling since living in [place]  Describes an episode of swelling around the eyes and a swollen finger while in [place] in the 1980s. Later in [place] she noticed soles of feet were swollen. In [place] she had periorbital edema. She tried antihistamine for itching of eyes. These are all episodic and not clearly seasonal except that cold temperatures may be an important trigger. Temperature change causes hoarseness. Claritin has been some help. One episode of blebs and vesicles on her skin while blood pressure was very high. Picture she shows me suggest that she had ankle edema at that time recent emergency room visit for similar complaints with response to prednisone taper. Occasionally hands get really cold. Can't tell about blanching or discoloration to suggest Raynaud's phenomena. She has not been on ACE inhibitors. Denies history of asthma, seasonal pollen allergic rhinitis or food allergies. Family-son has persistent dermatitis and others have asthma and rhinitis. Has had BCG vaccine  11/15/2015-60 year old female never smoker followed for urticaria,  problems with allergy.  feels like her head is going to bust.  she stated that she is having chills .  Allergy labs 07/14/2015 -Allergy Profile negative, food profile mild elevations only for shrimp and milk with total IgE 76, sedimentation rate 15, ANA negative, CRP low 0.2, CBC with differential normal, angioedema panel negative. LFTs have been normal as of 02/2015.  04/18/2016 -60 year old female never smoker followed for Urticaria, complicated by anxiety FOLLOWS FOR: Pt states since into the office this morning her voice is starting to get raspy-can tell a difference in the air in exam room vs lobby. Pt states otherwise she has done well with hives and  rash. She says she got rash on both feet after taking Pepcid once, so assumed that was the cause and has not taken it again. No more rash or urticaria. Feels very sensitive to drafts and cool air. With exposure she feels some prickling in her throat and skin. Took prednisone 10 mg once for this feeling. It helped but she got anxious when her heart rate went up a little bit.  ROS-see HPI   Negative unless "+" Constitutional:    weight loss, night sweats, fevers, chills, fatigue, lassitude. HEENT:    headaches, difficulty swallowing, tooth/dental problems, sore throat,       sneezing, itching, ear ache, nasal congestion, post nasal drip, snoring CV:    chest pain, orthopnea, PND, swelling in lower extremities, anasarca,  dizziness, palpitations Resp:   shortness of breath with exertion or at rest.                productive cough,   non-productive cough, coughing up of blood.              change in color of mucus.  wheezing.   Skin:    rash or lesions. GI:  No-   heartburn, indigestion, abdominal pain, nausea, vomiting, diarrhea,                 change in bowel habits, loss of appetite GU: dysuria, change in color of urine, no urgency or frequency.   flank pain. MS:   joint pain, stiffness, decreased range of motion, back pain. Neuro-     nothing unusual Psych:  change in mood or affect.  depression or anxiety.   memory  loss.  OBJ- Physical Exam General- Alert, Oriented, Affect-appropriate, Distress- none acute. + On first my arrival to exam room she was sitting with head down and voice was a little raspy. With distraction she sat up and spoke normally. Skin- rash-none, lesions- none, excoriation- none Lymphadenopathy- none Head- atraumatic            Eyes- Gross vision intact, PERRLA, conjunctivae and secretions clear            Ears- Hearing, canals-normal            Nose- `, no-Septal dev, + mucus, polyps, erosion, perforation             Throat- Mallampati III-IV, mucosa clear ,  drainage- none, tonsils- atrophic,+ hoarse, torus Neck- flexible , trachea midline, no stridor , thyroid nl, carotid no bruit Chest - symmetrical excursion , unlabored           Heart/CV- RRR , no murmur , no gallop  , no rub, nl s1 s2                           - JVD- none , edema- none, stasis changes- none, varices- none           Lung- clear to P&A, wheeze- none, cough- none , dullness-none, rub- none           Chest wall-  Abd-  Br/ Gen/ Rectal- Not done, not indicated Extrem- cyanosis- none, clubbing, none, atrophy- none, strength- nl Neuro- grossly intact to observation

## 2016-04-18 NOTE — Patient Instructions (Signed)
Ok to use occasional antihistamine like claritin  It seems to help you to protect your head and neck from drafts and cool air. Scarves, hoods and even a dust mask over nose and mouth help some people.

## 2016-04-18 NOTE — Assessment & Plan Note (Addendum)
I think there is a significant component of anxiety and somatic over awareness. Suggestion of cold intolerance raises possibility that this nonallergic process has been vasomotor/temperature related. She feels able to take Claritin "because I am used to it". She thinks prednisone gave relief although she thought it made her tachycardic after single 10 mg tablet. Plan-dressed appropriately for intermittent cool exposure such as air conditioner drafts-especially wear a hood if needed, to shield the head and neck

## 2016-05-12 ENCOUNTER — Encounter: Payer: Self-pay | Admitting: Family Medicine

## 2016-05-12 ENCOUNTER — Ambulatory Visit (HOSPITAL_BASED_OUTPATIENT_CLINIC_OR_DEPARTMENT_OTHER)
Admission: RE | Admit: 2016-05-12 | Discharge: 2016-05-12 | Disposition: A | Discharge status: Home / Self Care | Payer: BLUE CROSS/BLUE SHIELD | Source: Ambulatory Visit | Attending: Family Medicine | Admitting: Family Medicine

## 2016-05-12 ENCOUNTER — Telehealth: Payer: Self-pay | Admitting: Internal Medicine

## 2016-05-12 ENCOUNTER — Telehealth: Payer: Self-pay

## 2016-05-12 ENCOUNTER — Ambulatory Visit (INDEPENDENT_AMBULATORY_CARE_PROVIDER_SITE_OTHER): Payer: BLUE CROSS/BLUE SHIELD | Admitting: Family Medicine

## 2016-05-12 VITALS — BP 132/88 | HR 77 | Temp 98.1°F | Resp 12 | Ht 65.0 in | Wt 181.4 lb

## 2016-05-12 DIAGNOSIS — Y939 Activity, unspecified: Secondary | ICD-10-CM | POA: Insufficient documentation

## 2016-05-12 DIAGNOSIS — X58XXXA Exposure to other specified factors, initial encounter: Secondary | ICD-10-CM | POA: Diagnosis not present

## 2016-05-12 DIAGNOSIS — S76012A Strain of muscle, fascia and tendon of left hip, initial encounter: Secondary | ICD-10-CM | POA: Insufficient documentation

## 2016-05-12 MED ORDER — CYCLOBENZAPRINE HCL 5 MG PO TABS: 5.0000 mg | ORAL_TABLET | Freq: Every evening | ORAL | 0 refills | Status: DC | PRN

## 2016-05-12 MED ORDER — NAPROXEN 500 MG PO TABS: 500.0000 mg | ORAL_TABLET | Freq: Two times a day (BID) | ORAL | 0 refills | Status: DC

## 2016-05-12 MED ORDER — DICLOFENAC SODIUM 1 % TD GEL: TRANSDERMAL | 0 refills | Status: DC

## 2016-05-12 NOTE — Telephone Encounter (Signed)
PA initiated via CoverMyMeds: KEY: [id number]. Awaiting determination.

## 2016-05-12 NOTE — Telephone Encounter (Signed)
Pt called in for her X-Ray results.   Please advise.   CB: [phone]

## 2016-05-12 NOTE — Telephone Encounter (Signed)
Please advise 

## 2016-05-12 NOTE — Telephone Encounter (Signed)
Normal

## 2016-05-12 NOTE — Patient Instructions (Signed)
RANGE OF MOTION (ROM) AND STRETCHING EXERCISES  These exercises may help you when beginning to rehabilitate your injury. Doing them too aggressively can worsen your condition. Complete them slowly and gently. Your symptoms may resolve with or without further involvement from your physician, physical therapist or athletic trainer. While completing these exercises, remember:   Restoring tissue flexibility helps normal motion to return to the joints. This allows healthier, less painful movement and activity.  An effective stretch should be held for at least 30 seconds.  A stretch should never be painful. You should only feel a gentle lengthening or release in the stretched tissue. If these stretches worsen your symptoms even when done gently, consult your physician, physical therapist or athletic trainer.  STRETCH - Hamstrings, Supine   Lie on your back. Loop a belt or towel over the ball of your right / left foot.  Straighten your right / left knee and slowly pull on the belt to raise your leg. Do not allow the right / left knee to bend. Keep your opposite leg flat on the floor.  Raise the leg until you feel a gentle stretch behind your right / left knee or thigh. Hold this position for 15-20 seconds. Repeat 2-3 times. Complete this stretch 1 times per day.   STRETCH - Hip Rotators   Lie on your back on a firm surface. Grasp your right / left knee with your right / left hand and your ankle with your opposite hand.  Keeping your hips and shoulders firmly planted, gently pull your right / left knee and rotate your lower leg toward your opposite shoulder until you feel a stretch in your buttocks.  Hold this stretch for __________ seconds. Repeat this stretch __________ times. Complete this stretch __________ times per day.  STRETCH - Hamstrings/Adductors, V-Sit   Sit on the floor with your legs extended in a large "V," keeping your knees straight.  With your head and chest upright, bend at  your waist reaching for your right foot to stretch your left adductors.  You should feel a stretch in your left inner thigh. Hold for __________ seconds.  Return to the upright position to relax your leg muscles.  Continuing to keep your chest upright, bend straight forward at your waist to stretch your hamstrings.  You should feel a stretch behind both of your thighs and/or knees. Hold for __________ seconds.  Return to the upright position to relax your leg muscles.  Repeat steps 2 through 4 for opposite leg. Repeat __________ times. Complete this exercise __________ times per day.  STRETCHING - Hip Flexors, Lunge  Half kneel with your right / left knee on the floor and your opposite knee bent and directly over your ankle.  Keep good posture with your head over your shoulders. Tighten your buttocks to point your tailbone downward; this will prevent your back from arching too much.  You should feel a gentle stretch in the front of your thigh and/or hip. If you do not feel any resistance, slightly slide your opposite foot forward and then slowly lunge forward so your knee once again lines up over your ankle. Be sure your tailbone remains pointed downward.  Hold this stretch for __________ seconds. Repeat __________ times. Complete this stretch __________ times per day.

## 2016-05-12 NOTE — Progress Notes (Signed)
Pre visit review using our clinic review tool, if applicable. No additional management support is needed unless otherwise documented below in the visit note. 

## 2016-05-12 NOTE — Progress Notes (Signed)
Musculoskeletal Exam  Patient: Anne Ramirez DOB: July 01, 1956  DOS: 05/12/2016  SUBJECTIVE:  Chief Complaint:   Chief Complaint  Patient presents with  . Leg Pain    L leg, working in yard 1-2 weeks ago, pain ranges to groin area, denies redness, rash, has been using left over narcotics    Anne Ramirez is a 60 y.o.  female for evaluation and treatment of L thigh pain.   Onset:  1 week ago. Was gardening beforehand, sudden. No specific injury or incident otherwise. Location: Prox anterior L thigh Character:  dull  Progression of issue:  is unchanged Associated symptoms: Mild swelling over the area Denies redness, fevers. Treatment: to date has been ice, OTC NSAIDS and heart.   Neurovascular symptoms: no  ROS: Musculoskeletal/Extremities: +L thigh pain   Past Medical History:  Diagnosis Date  . Back injury    2007  . Hyperlipidemia    h/o myalgias w/ crestor   Past Surgical History:  Procedure Laterality Date  . ABDOMINAL HYSTERECTOMY  1989   Hyst and oophorectomy  . HEMORRHOIDECTOMY WITH HEMORRHOID BANDING  2010   Family History  Problem Relation Age of Onset  . Kidney disease Brother   . Hyperlipidemia Maternal Grandmother   . Stroke Maternal Grandmother   . Hypertension Maternal Grandmother   . Hyperlipidemia Maternal Grandfather   . Stroke Maternal Grandfather   . Hypertension Maternal Grandfather   . Hyperlipidemia Paternal Grandmother   . Stroke Paternal Grandmother   . Hypertension Paternal Grandmother   . Hyperlipidemia Paternal Grandfather   . Stroke Paternal Grandfather   . Hypertension Paternal Grandfather   . Colon cancer Neg Hx   . Breast cancer Neg Hx    Current Outpatient Prescriptions  Medication Sig Dispense Refill  . acetaminophen (TYLENOL) 500 MG tablet Take 1 tablet (500 mg total) by mouth every 6 (six) hours as needed for pain. 30 tablet 0  . cholecalciferol (VITAMIN D) 1000 units tablet Take 1,000 Units by mouth daily.    .  hydrochlorothiazide (HYDRODIURIL) 25 MG tablet Take 1 tablet (25 mg total) by mouth daily. 30 tablet 6  . loratadine (CLARITIN) 10 MG tablet Take 10 mg by mouth daily.     Marland Kitchen. nystatin-triamcinolone ointment (MYCOLOG) Apply 1 application topically 2 (two) times daily. 60 g 0  . potassium chloride (K-DUR,KLOR-CON) 10 MEQ tablet Take 2 tablets (20 mEq total) by mouth daily. 60 tablet 6   Allergies  Allergen Reactions  . Other Swelling    Sudden temperature change   Social History   Social History  . Marital status: Divorced  . 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 Smoking status: Never Smoker  . Smokeless tobacco: Never Used  . Alcohol use No  . 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 No  acute distress. Cardiovascular: Brisk cap refill Thorax & Lungs: No accessory muscle Extremities: No clubbing. No cyanosis. No edema.  Skin: Warm. Dry. No erythema. No rash.  Musculoskeletal: L hip.   Normal active range of motion: yes.   Normal passive range of motion: yes Tenderness to palpation: no Mild increased fullness in prox thigh group compared to R Deformity: no Ecchymosis: no Tests positive: Stinchfield Tests negative: Log roll, Patrick's, FADDIR, Thomas Neurologic: Normal sensory function. No focal deficits noted. DTR's equal and symmetry in LE's. No clonus. Psychiatric: Normal mood. Age appropriate judgment and insight. Alert & oriented x 3.     Assessment:  Strain of hip flexor, left, initial encounter - Plan: DG HIP UNILAT WITH PELVIS 2-3 VIEWS LEFT, diclofenac sodium (VOLTAREN) 1 % GEL, cyclobenzaprine (FLEXERIL) 5 MG tablet, naproxen (NAPROSYN) 500 MG tablet  Plan: Orders as above. I believe this is musculoskeletal. There is no warmth or asymmetry to suggest a cellulitis, abscess, etc.   Will try Voltaren gel, if too expensive, will do Naproxen. Stretches given. XR to r/o joint involvement. Told to assume this is normal unless she hear's from [place]. F/u in 4 weeks if symptoms fail to improve, sooner if they worsen. The patient voiced understanding and agreement to the plan.   Jilda Roche [place], DO 05/12/16  8:25 AM

## 2016-05-15 NOTE — Telephone Encounter (Signed)
Pt is aware of Xray results and questioned if something was wrong with her muscle. Please advise. TL/CMA

## 2016-05-15 NOTE — Telephone Encounter (Signed)
Strain of muscle, will improve with time.

## 2016-05-16 NOTE — Telephone Encounter (Signed)
Received PA denial notification. Denied due to request not meeting medical necessity as covered under Pt's insurance benefit plan. No alternative given. Appeal information provided, which will be needed in form of letter. Please advise.

## 2016-05-17 NOTE — Telephone Encounter (Signed)
OK to disregard. Pt was going to use topical if it was covered, will use Naproxen that was rx'd instead. Thanks.

## 2016-05-17 NOTE — Telephone Encounter (Signed)
Noted, PA denial notification sent for scanning.

## 2016-05-19 ENCOUNTER — Other Ambulatory Visit: Payer: Self-pay | Admitting: Internal Medicine

## 2016-05-19 ENCOUNTER — Other Ambulatory Visit: Payer: Self-pay

## 2016-05-19 MED ORDER — HYDROCHLOROTHIAZIDE 25 MG PO TABS
25.0000 mg | ORAL_TABLET | Freq: Every day | ORAL | 1 refills | Status: DC
Start: 2016-05-19 — End: 2016-09-13

## 2016-06-07 ENCOUNTER — Ambulatory Visit: Payer: Self-pay | Admitting: Family Medicine

## 2016-09-12 ENCOUNTER — Ambulatory Visit (INDEPENDENT_AMBULATORY_CARE_PROVIDER_SITE_OTHER): Payer: BLUE CROSS/BLUE SHIELD | Admitting: Internal Medicine

## 2016-09-12 ENCOUNTER — Encounter: Payer: Self-pay | Admitting: Internal Medicine

## 2016-09-12 VITALS — BP 132/80 | HR 81 | Temp 98.1°F | Resp 14 | Ht 65.0 in | Wt 182.1 lb

## 2016-09-12 DIAGNOSIS — I10 Essential (primary) hypertension: Secondary | ICD-10-CM

## 2016-09-12 DIAGNOSIS — R21 Rash and other nonspecific skin eruption: Secondary | ICD-10-CM | POA: Diagnosis not present

## 2016-09-12 DIAGNOSIS — E559 Vitamin D deficiency, unspecified: Secondary | ICD-10-CM

## 2016-09-12 DIAGNOSIS — R739 Hyperglycemia, unspecified: Secondary | ICD-10-CM

## 2016-09-12 LAB — BASIC METABOLIC PANEL
BUN: 10 mg/dL (ref 6–23)
CHLORIDE: 101 meq/L (ref 96–112)
CO2: 32 mEq/L (ref 19–32)
Calcium: 9.3 mg/dL (ref 8.4–10.5)
Creatinine, Ser: 0.81 mg/dL (ref 0.40–1.20)
GFR: 92.62 mL/min (ref >60.00)
GLUCOSE: 101 mg/dL — AB (ref 70–99)
POTASSIUM: 3 meq/L — AB (ref 3.5–5.1)
Sodium: 140 mEq/L (ref 135–145)

## 2016-09-12 MED ORDER — HYDROCORTISONE 2.5 % EX CREA: TOPICAL_CREAM | Freq: Two times a day (BID) | CUTANEOUS | 0 refills | Status: DC

## 2016-09-12 NOTE — Progress Notes (Signed)
Subjective:    Patient ID: Anne Ramirez, female    DOB: Jan 06, 1956, 61 y.o.   MRN: [id number]  DOS:  09/12/2016 Type of visit - description : Routine checkup Interval history: Rash under her breasts: Was prescribed Mycolog, improve temporarily, rash come back on and off HTN: Good med compliance, no ambulatory BPs except for last night, it was 145/94, recheck 121/85 High cholesterol: Declined to take medications. Has a couple of lesions on the gum,  dentisit did a bx  few months ago, still concerned.   Review of Systems Diet and exercise: Not very good the last few months, needs improvement  Past Medical History:  Diagnosis Date  . Back injury    2007  . Hyperlipidemia    h/o myalgias w/ crestor    Past Surgical History:  Procedure Laterality Date  . ABDOMINAL HYSTERECTOMY  1989   Hyst and oophorectomy  . HEMORRHOIDECTOMY WITH HEMORRHOID BANDING  2010    Social History   Social History  . Marital status: Divorced    Spouse name: N/A  . Number of children: 2  . 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 Smoking status: Never Smoker  . Smokeless tobacco: Never Used  . Alcohol use No  . Drug use: No  . Sexual activity: Not on file   Other Topics Concern  . 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 Always use your most recent med list.          acetaminophen 500 MG tablet Commonly known as:  TYLENOL Take 1 tablet (500 mg total) by mouth every 6 (six) hours as needed for pain.   cholecalciferol 1000 units tablet Commonly known as:  VITAMIN D Take 1,000 Units by  mouth daily.   CLARITIN 10 MG tablet Generic drug:  loratadine Take 10 mg by mouth daily.   hydrochlorothiazide 25 MG tablet Commonly known as:  HYDRODIURIL Take 1 tablet (25 mg total) by mouth daily.   hydrocortisone 2.5 % cream Apply topically 2 (two) times daily.   potassium chloride 10 MEQ tablet Commonly known as:  KLOR-CON M10 Take 2 tablets (20 mEq total) by mouth daily.          Objective:   Physical Exam BP 132/80 (BP Location: Left Arm, Patient Position: Sitting, Cuff Size: Normal)   Pulse 81   Temp 98.1 F (36.7 C) (Oral)   Resp 14   Ht 5\' 5"  (1.651 m)   Wt 182 lb 2 oz (82.6 kg)   SpO2 98%   BMI 30.31 kg/m  General:   Well developed, well nourished . NAD.  HEENT:  Normocephalic . Face symmetric, atraumatic. Oral civity  inspected, she has a couple of hypochromic, small lesions on the gum. Lungs:  CTA B Normal respiratory effort, no intercostal retractions, no accessory muscle use. Heart: RRR,  no murmur.  No pretibial edema bilaterally  Skin: Under both breasts, skin looks hyperhromic,  is covered with powder: Hard to assess due to powder but  there but is not weeping, scaliness or satellite lesions. Neurologic:  alert & oriented X3.  Speech normal, gait appropriate for age and unassisted Psych--  Cognition and judgment appear intact.  Cooperative with normal attention span and concentration.  Behavior appropriate. No anxious or depressed appearing.      Assessment & Plan:   Assessment Prediabetes HTN -- started HCTZ 02-2015 Hyperlipidemia ----  myalgias with Crestor Low Vit D dx 02-2015  Back injury 2007  Allergic reactions: Saw Dr. Maple Hudson 06-2015 ; sx started before HCTZ  PLAN:  Prediabetes: Last A1c 6.0, stable, recommend a healthy diet and exercise HTN: Ambulatory BPs except for last night, see above. Continue HCTZ, KCl, check a BMP, rec ambulatory BPs Hyperlipidemia: Recent labs discussed, LDL 162, I rec statins, pt declined d/t h/o s/e  w/  Crestor but has not try other statins, recommend diet, exercise and at some point asked her to re-consider cholesterol medicine. Vitamin D: Last labs okay, continue OTCs Rash: Rec to use powder consistently to keep the area dry, will prescribe hydrocortisone 2.5%  Prn Oral lesions: S/P BX by dentist, recommend to continue discussing with them. ENT referral if patient elects RTC [phone]

## 2016-09-12 NOTE — Progress Notes (Signed)
Pre visit review using our clinic review tool, if applicable. No additional management support is needed unless otherwise documented below in the visit note. 

## 2016-09-12 NOTE — Patient Instructions (Addendum)
GO TO THE LAB : Get the blood work     GO TO THE FRONT DESK Schedule your next appointment for a  physical exam by 02-2017. Fasting  Use powder consistently under the breast  If the area gets irritated, use hydrocortisone 2.5% twice a day as needed  Check the  blood pressure 2 or 3 times a month   Be sure your blood pressure is between 110/65 and  135/85.  if it is consistently higher or lower, let me know

## 2016-09-12 NOTE — Assessment & Plan Note (Signed)
Prediabetes: Last A1c 6.0, stable, recommend a healthy diet and exercise HTN: Ambulatory BPs except for last night, see above. Continue HCTZ, KCl, check a BMP, rec ambulatory BPs Hyperlipidemia: Recent labs discussed, LDL 162, I rec statins, pt declined d/t h/o s/e w/  Crestor but has not try other statins, recommend diet, exercise and at some point asked her to re-consider cholesterol medicine. Vitamin D: Last labs okay, continue OTCs Rash: Rec to use powder consistently to keep the area dry, will prescribe hydrocortisone 2.5%  Prn Oral lesions: S/P BX by dentist, recommend to continue discussing with them. ENT referral if patient elects RTC [phone]

## 2016-09-13 ENCOUNTER — Other Ambulatory Visit: Payer: Self-pay | Admitting: Internal Medicine

## 2016-09-13 MED ORDER — TRIAMTERENE-HCTZ 37.5-25 MG PO TABS: 1.0000 | ORAL_TABLET | Freq: Every day | ORAL | 1 refills | Status: DC

## 2016-09-14 NOTE — Addendum Note (Signed)
Addended byConrad Sedan: Oviya Ammar D on: 09/14/2016 09:50 AM   Modules accepted: Orders

## 2016-10-17 ENCOUNTER — Other Ambulatory Visit (INDEPENDENT_AMBULATORY_CARE_PROVIDER_SITE_OTHER): Payer: BLUE CROSS/BLUE SHIELD

## 2016-10-17 DIAGNOSIS — I10 Essential (primary) hypertension: Secondary | ICD-10-CM | POA: Diagnosis not present

## 2016-10-17 DIAGNOSIS — E876 Hypokalemia: Secondary | ICD-10-CM

## 2016-10-17 LAB — BASIC METABOLIC PANEL
BUN: 12 mg/dL (ref 6–23)
CO2: 34 meq/L — AB (ref 19–32)
CREATININE: 0.92 mg/dL (ref 0.40–1.20)
Calcium: 9.8 mg/dL (ref 8.4–10.5)
Chloride: 98 mEq/L (ref 96–112)
GFR: 79.94 mL/min (ref >60.00)
GLUCOSE: 117 mg/dL — AB (ref 70–99)
Potassium: 3.3 mEq/L — ABNORMAL LOW (ref 3.5–5.1)
Sodium: 140 mEq/L (ref 135–145)

## 2016-10-20 MED ORDER — POTASSIUM CHLORIDE ER 10 MEQ PO TBCR: 10.0000 meq | EXTENDED_RELEASE_TABLET | Freq: Every day | ORAL | 1 refills | Status: DC

## 2016-10-20 MED ORDER — TRIAMTERENE-HCTZ 37.5-25 MG PO TABS: 1.0000 | ORAL_TABLET | Freq: Every day | ORAL | 1 refills | Status: DC

## 2016-10-24 ENCOUNTER — Other Ambulatory Visit: Payer: Self-pay

## 2016-10-24 MED ORDER — TRIAMTERENE-HCTZ 37.5-25 MG PO TABS: 1.0000 | ORAL_TABLET | Freq: Every day | ORAL | 0 refills | Status: DC

## 2016-11-07 ENCOUNTER — Other Ambulatory Visit (INDEPENDENT_AMBULATORY_CARE_PROVIDER_SITE_OTHER): Payer: BLUE CROSS/BLUE SHIELD

## 2016-11-07 DIAGNOSIS — E876 Hypokalemia: Secondary | ICD-10-CM

## 2016-11-07 LAB — BASIC METABOLIC PANEL
BUN: 15 mg/dL (ref 6–23)
CO2: 32 mEq/L (ref 19–32)
Calcium: 9.8 mg/dL (ref 8.4–10.5)
Chloride: 98 mEq/L (ref 96–112)
Creatinine, Ser: 0.91 mg/dL (ref 0.40–1.20)
GFR: 80.94 mL/min (ref >60.00)
Glucose, Bld: 109 mg/dL — ABNORMAL HIGH (ref 70–99)
POTASSIUM: 3.4 meq/L — AB (ref 3.5–5.1)
SODIUM: 138 meq/L (ref 135–145)

## 2017-01-26 ENCOUNTER — Other Ambulatory Visit: Payer: Self-pay | Admitting: Internal Medicine

## 2017-01-26 DIAGNOSIS — Z1231 Encounter for screening mammogram for malignant neoplasm of breast: Secondary | ICD-10-CM

## 2017-03-13 ENCOUNTER — Ambulatory Visit: Payer: Self-pay

## 2017-03-20 ENCOUNTER — Ambulatory Visit
Admission: RE | Admit: 2017-03-20 | Discharge: 2017-03-20 | Disposition: A | Discharge status: Home / Self Care | Payer: BLUE CROSS/BLUE SHIELD | Source: Ambulatory Visit | Attending: Internal Medicine | Admitting: Internal Medicine

## 2017-03-20 DIAGNOSIS — Z1231 Encounter for screening mammogram for malignant neoplasm of breast: Secondary | ICD-10-CM

## 2017-03-27 ENCOUNTER — Telehealth: Payer: Self-pay | Admitting: Internal Medicine

## 2017-03-27 ENCOUNTER — Ambulatory Visit (INDEPENDENT_AMBULATORY_CARE_PROVIDER_SITE_OTHER): Payer: BLUE CROSS/BLUE SHIELD | Admitting: Internal Medicine

## 2017-03-27 ENCOUNTER — Other Ambulatory Visit: Payer: Self-pay | Admitting: Internal Medicine

## 2017-03-27 ENCOUNTER — Encounter: Payer: Self-pay | Admitting: Internal Medicine

## 2017-03-27 VITALS — BP 128/78 | HR 92 | Temp 97.8°F | Resp 14 | Ht 65.0 in | Wt 184.0 lb

## 2017-03-27 DIAGNOSIS — Z0000 Encounter for general adult medical examination without abnormal findings: Secondary | ICD-10-CM

## 2017-03-27 DIAGNOSIS — I10 Essential (primary) hypertension: Secondary | ICD-10-CM | POA: Diagnosis not present

## 2017-03-27 LAB — COMPREHENSIVE METABOLIC PANEL
ALBUMIN: 4.6 g/dL (ref 3.5–5.2)
ALT: 13 U/L (ref 0–35)
AST: 15 U/L (ref 0–37)
Alkaline Phosphatase: 63 U/L (ref 39–117)
BUN: 14 mg/dL (ref 6–23)
CALCIUM: 10.2 mg/dL (ref 8.4–10.5)
CHLORIDE: 101 meq/L (ref 96–112)
CO2: 33 meq/L — AB (ref 19–32)
Creatinine, Ser: 0.79 mg/dL (ref 0.40–1.20)
GFR: 95.16 mL/min (ref >60.00)
Glucose, Bld: 108 mg/dL — ABNORMAL HIGH (ref 70–99)
POTASSIUM: 4.4 meq/L (ref 3.5–5.1)
Sodium: 141 mEq/L (ref 135–145)
Total Bilirubin: 0.4 mg/dL (ref 0.2–1.2)
Total Protein: 7.4 g/dL (ref 6.0–8.3)

## 2017-03-27 LAB — CBC WITH DIFFERENTIAL/PLATELET
BASOS ABS: 0.1 10*3/uL (ref 0.0–0.1)
Basophils Relative: 0.8 % (ref 0.0–3.0)
EOS ABS: 0.2 10*3/uL (ref 0.0–0.7)
Eosinophils Relative: 2.4 % (ref 0.0–5.0)
HEMATOCRIT: 43.8 % (ref 36.0–46.0)
Hemoglobin: 14.1 g/dL (ref 12.0–15.0)
LYMPHS PCT: 48.9 % — AB (ref 12.0–46.0)
Lymphs Abs: 3.1 10*3/uL (ref 0.7–4.0)
MCHC: 32.3 g/dL (ref 30.0–36.0)
MCV: 87 fl (ref 78.0–100.0)
MONOS PCT: 6.2 % (ref 3.0–12.0)
Monocytes Absolute: 0.4 10*3/uL (ref 0.1–1.0)
NEUTROS PCT: 41.7 % — AB (ref 43.0–77.0)
Neutro Abs: 2.7 10*3/uL (ref 1.4–7.7)
Platelets: 322 10*3/uL (ref 150.0–400.0)
RBC: 5.04 Mil/uL (ref 3.87–5.11)
RDW: 14.2 % (ref 11.5–15.5)
WBC: 6.4 10*3/uL (ref 4.0–10.5)

## 2017-03-27 LAB — LIPID PANEL
CHOL/HDL RATIO: 4
Cholesterol: 233 mg/dL — ABNORMAL HIGH (ref 0–200)
HDL: 52.9 mg/dL (ref >39.00)
LDL CALC: 154 mg/dL — AB (ref 0–99)
NonHDL: 180.04
TRIGLYCERIDES: 129 mg/dL (ref 0.0–149.0)
VLDL: 25.8 mg/dL (ref 0.0–40.0)

## 2017-03-27 LAB — HEMOGLOBIN A1C: Hgb A1c MFr Bld: 6.1 % (ref 4.6–6.5)

## 2017-03-27 LAB — TSH: TSH: 1.58 u[IU]/mL (ref 0.35–4.50)

## 2017-03-27 MED ORDER — TRIAMTERENE-HCTZ 37.5-25 MG PO TABS: 1.0000 | ORAL_TABLET | Freq: Every day | ORAL | 1 refills | Status: DC

## 2017-03-27 MED ORDER — HYDROCORTISONE 2.5 % EX CREA: TOPICAL_CREAM | Freq: Two times a day (BID) | CUTANEOUS | 5 refills | Status: AC

## 2017-03-27 NOTE — Telephone Encounter (Signed)
Per Dr. Drue NovelPaz- Pt will need to contact her allergist for refills.

## 2017-03-27 NOTE — Progress Notes (Signed)
Pre visit review using our clinic review tool, if applicable. No additional management support is needed unless otherwise documented below in the visit note. 

## 2017-03-27 NOTE — Patient Instructions (Addendum)
GO TO THE LAB : Get the blood work    GO TO THE FRONT DESK Schedule your next appointment for a  checkup in 6 months  =================  Go back on triamterene HCTZ 1 tablet daily.   Blood work today but also in 2 weeks to check your potassium    Check the  blood pressure 2 or 3 times a month   Be sure your blood pressure is between 110/65 and  140/85. If it is consistently higher or lower, let me know

## 2017-03-27 NOTE — Telephone Encounter (Signed)
Self.  Refill for predniSONE    Pharmacy: [id number] - [place],  - [address]

## 2017-03-27 NOTE — Assessment & Plan Note (Addendum)
-  Tdap 2012; shingrex-zostavax discussed -CCS: Colonoscopy 08/07/2008, no polyps; cscope again 2015, polyps, 5 years . Dr. Bosie ClosSchooler  -[facility] , no recent visits. Dr Ernestina PennaFogleman. H/o hysteroctomy 1989, no malignancy, last PAP 2013 (-) [facility] 03-2017. -DEXA: declined, d/t cost, recommend vitamin D -Labs: CMP, FLP, CBC, A1c, TSH Diet-exercise discussed

## 2017-03-27 NOTE — Telephone Encounter (Signed)
Tried leaving a vm for pt to make her aware at # provided.

## 2017-03-27 NOTE — Progress Notes (Signed)
Subjective:    Patient ID: Anne Ramirez, female    DOB: 29-Jan-1956, 61 y.o.   MRN: [id number]  DOS:  03/27/2017 Type of visit - description : cpx Interval history: HTN: Currently taking only potassium, not taking diuretics, apparently a misunderstanding. Her ambulatory BPs are okay but sometimes as high as 140/97-110 thus needs better control  Review of Systems  Reports the stress due to work and going to college Continue with on and off rash under the breast, decrease with topical steroids, very itchy when rash is present  Other than above, a 14 point review of systems is negative    Past Medical History:  Diagnosis Date  . Back injury    2007  . Hyperlipidemia    h/o myalgias w/ crestor    Past Surgical History:  Procedure Laterality Date  . ABDOMINAL HYSTERECTOMY  1989   Hyst and oophorectomy , no malignancy  . HEMORRHOIDECTOMY WITH HEMORRHOID BANDING  2010    Social History   Social History  . Marital status: Divorced    Spouse name: N/A  . Number of children: 2  . 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 Smoking status: Never Smoker  . Smokeless tobacco: Never Used  . Alcohol use No  . Drug use: No  . Sexual activity: Not on file   Other Topics Concern  . 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 Kidney disease Brother   . Hyperlipidemia Maternal Grandmother   . Stroke Maternal Grandmother   . Hypertension Maternal Grandmother   . Hyperlipidemia Maternal Grandfather   . Stroke Maternal Grandfather   . Hypertension Maternal Grandfather   . Hyperlipidemia Paternal Grandmother   . Stroke Paternal Grandmother   . Hypertension Paternal Grandmother   . Hyperlipidemia  Paternal Grandfather   . Stroke Paternal Grandfather   . Hypertension Paternal Grandfather   . Colon cancer Neg Hx   . Breast cancer Neg Hx       Allergies as of 03/27/2017      Reactions   Other Swelling   Sudden temperature change      Medication List       Accurate as of 03/27/17 11:59 PM. Always use your most recent med list.          cholecalciferol 1000 units tablet Commonly known as:  VITAMIN D Take 1,000 Units by mouth daily.   hydrocortisone 2.5 % cream Apply topically 2 (two) times daily.   potassium chloride 10 MEQ tablet Commonly known as:  K-DUR Take 1 tablet (10 mEq total) by mouth daily.   predniSONE 5 MG tablet Commonly known as:  DELTASONE Take 5 mg by mouth daily as needed.   predniSONE 10 MG tablet Commonly known as:  DELTASONE TAKE 1 TO 2 TABLETS BY MOUTH EVERY DAY AS NEEDED   triamterene-hydrochlorothiazide 37.5-25 MG tablet Commonly known as:  MAXZIDE-25 Take 1 tablet by mouth daily.            Discharge Care Instructions        Start     Ordered   03/27/17 0000  Comp Met (  CMET)     03/27/17 0921   03/27/17 0000  Lipid panel     03/27/17 0921   03/27/17 0000  CBC w/Diff     03/27/17 0921   03/27/17 0000  Hemoglobin A1c     03/27/17 0921   03/27/17 0000  TSH     03/27/17 0921   03/27/17 0000  triamterene-hydrochlorothiazide (MAXZIDE-25) 37.5-25 MG tablet  Daily     03/27/17 0931   03/27/17 0000  hydrocortisone 2.5 % cream  2 times daily     03/27/17 0931   03/27/17 3016  Basic metabolic panel     07/25/30 0932         Objective:   Physical Exam  Skin:      BP 128/78 (BP Location: Left Arm, Patient Position: Sitting, Cuff Size: Normal)   Pulse 92   Temp 97.8 F (36.6 C) (Oral)   Resp 14   Ht _0  (1.651 m)   Wt 184 lb (83.5 kg)   SpO2 97%   BMI 30.62 kg/m   General:   Well developed, well nourished . NAD.  Neck: No  thyromegaly  HEENT:  Normocephalic . Face symmetric, atraumatic Lungs:  CTA B Normal  respiratory effort, no intercostal retractions, no accessory muscle use. Heart: RRR,  no murmur.  No pretibial edema bilaterally  Abdomen:  Not distended, soft, non-tender. No rebound or rigidity.   Neurologic:  alert & oriented X3.  Speech normal, gait appropriate for age and unassisted Strength symmetric and appropriate for age.  Psych: Cognition and judgment appear intact.  Cooperative with normal attention span and concentration.  Behavior appropriate. No anxious or depressed appearing.    Assessment & Plan:   Assessment Prediabetes HTN -- started HCTZ 02-2015 Hyperlipidemia ----  myalgias with Crestor Low Vit D dx 02-2015  Back injury 2007  Allergic reactions: Saw Dr. Annamaria Boots 06-2015 ( sx started before HCTZ)  PLAN:  Prediabetes: Check A1c, diet controlled HTN: Due to a misunderstanding, she stopped Maxide but is taking potassium. Ambulatory BPs occasionally elevated thus recommend to go back on diuretics, BMP in 2 weeks. Rash: Continue hydrocortisone 2.5% when necessary. Allergies: Reports is taking prednisone prn, RX by her allergist RTC 6 months

## 2017-03-28 NOTE — Assessment & Plan Note (Signed)
Prediabetes: Check A1c, diet controlled HTN: Due to a misunderstanding, she stopped Maxide but is taking potassium. Ambulatory BPs occasionally elevated thus recommend to go back on diuretics, BMP in 2 weeks. Rash: Continue hydrocortisone 2.5% when necessary. Allergies: Reports is taking prednisone prn, RX by her allergist RTC 6 months

## 2017-04-11 ENCOUNTER — Other Ambulatory Visit (INDEPENDENT_AMBULATORY_CARE_PROVIDER_SITE_OTHER): Payer: BLUE CROSS/BLUE SHIELD

## 2017-04-11 DIAGNOSIS — I10 Essential (primary) hypertension: Secondary | ICD-10-CM | POA: Diagnosis not present

## 2017-04-11 LAB — BASIC METABOLIC PANEL
BUN: 18 mg/dL (ref 6–23)
CHLORIDE: 97 meq/L (ref 96–112)
CO2: 32 meq/L (ref 19–32)
CREATININE: 0.92 mg/dL (ref 0.40–1.20)
Calcium: 10.3 mg/dL (ref 8.4–10.5)
GFR: 79.81 mL/min (ref >60.00)
Glucose, Bld: 108 mg/dL — ABNORMAL HIGH (ref 70–99)
POTASSIUM: 4.1 meq/L (ref 3.5–5.1)
SODIUM: 138 meq/L (ref 135–145)

## 2017-05-09 ENCOUNTER — Encounter: Payer: Self-pay | Admitting: Internal Medicine

## 2017-05-09 ENCOUNTER — Ambulatory Visit (INDEPENDENT_AMBULATORY_CARE_PROVIDER_SITE_OTHER): Payer: BLUE CROSS/BLUE SHIELD | Admitting: Internal Medicine

## 2017-05-09 VITALS — BP 128/88 | HR 71 | Temp 98.2°F | Resp 16 | Wt 184.4 lb

## 2017-05-09 DIAGNOSIS — R252 Cramp and spasm: Secondary | ICD-10-CM

## 2017-05-09 NOTE — Assessment & Plan Note (Signed)
Muscle cramps: As described above, pt is concerned sx could be related to hypokalemia but the last 2 levels were normal. She is on diuretics thus recommend OTC magnesium daily. Also recommend a trial with tonic water which has quinine, that has helped many of my patients. Daily stretching is also recommended I also offered possibly a muscle relaxant but she is reluctant because in the past they cause some drowsiness. Wheezing: Reports random episodes of wheezing not associated w/ other sxs, no h/o asthma, lung exam today normal. Recommend observation.

## 2017-05-09 NOTE — Progress Notes (Signed)
Subjective:    Patient ID: Anne Ramirez, female    DOB: 03/30/1956, 61 y.o.   MRN: [id number]  DOS:  05/09/2017 Type of visit - description : acute Interval history:  On and off problems with cramps at different places: Legs, arms, more noticeable in the morning. She is here today because a couple of days ago she bend over to pick up something from the floor and on her way down she developed abdominal cramps for a minute or 2. Reports that the cramps are sometimes very intense. Also reports occasional wheezing, no history of asthma, not associated chest pain or difficulty breathing  Review of Systems Denies neck pain, has mild low back pain , at baseline. No arthralgias No headache or fevers   Past Medical History:  Diagnosis Date  . Back injury    2007  . Hyperlipidemia    h/o myalgias w/ crestor    Past Surgical History:  Procedure Laterality Date  . ABDOMINAL HYSTERECTOMY  1989   Hyst and oophorectomy , no malignancy  . HEMORRHOIDECTOMY WITH HEMORRHOID BANDING  2010    Social History   Social History  . Marital status: Divorced    Spouse name: N/A  . Number of children: 2  . 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 Smoking status: Never Smoker  . Smokeless tobacco: Never Used  . Alcohol use No  . Drug use: No  . Sexual activity: Not on file   Other Topics Concern  . 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 Always use your most recent med list.          cholecalciferol 1000 units tablet Commonly known as:  VITAMIN D Take 1,000 Units by mouth daily.     hydrocortisone 2.5 % cream Apply topically 2 (two) times daily.   potassium chloride 10 MEQ tablet Commonly known as:  K-DUR Take 1 tablet (10 mEq total) by mouth daily.   predniSONE 5 MG tablet Commonly known as:  DELTASONE Take 5 mg by mouth daily as needed.   predniSONE 10 MG tablet Commonly known as:  DELTASONE TAKE 1 TO 2 TABLETS BY MOUTH EVERY DAY AS NEEDED   triamterene-hydrochlorothiazide 37.5-25 MG tablet Commonly known as:  MAXZIDE-25 Take 1 tablet by mouth daily.          Objective:   Physical Exam BP 128/88 (BP Location: Left Arm, Patient Position: Sitting, Cuff Size: Normal)   Pulse 71   Temp 98.2 F (36.8 C) (Oral)   Resp 16   Wt 184 lb 6 oz (83.6 kg)   SpO2 98%   BMI 30.68 kg/m  General:   Well developed, well nourished . NAD.  HEENT:  Normocephalic . Face symmetric, atraumatic MSK: Hands and wrists without synovitis Lungs:  CTA B Normal respiratory effort, no intercostal retractions, no accessory muscle use. Heart: RRR,  no murmur.  No pretibial edema bilaterally  Skin: Not pale. Not  jaundice Neurologic:  alert & oriented X3.  Speech normal, gait appropriate for age and unassisted Psych--  Cognition and judgment appear intact.  Cooperative with normal attention span and concentration.  Behavior appropriate. No anxious or depressed appearing.      Assessment & Plan:   Assessment Prediabetes HTN -- started HCTZ 02-2015 Hyperlipidemia ----  myalgias with Crestor Low Vit D dx 02-2015  Back injury 2007  Allergic reactions: Saw Dr. Maple HudsonYoung 06-2015 ( sx started before HCTZ)  PLAN:  Muscle cramps: As described above, pt is concerned sx could be related to hypokalemia but the last 2 levels were normal. She is on diuretics thus recommend OTC magnesium daily. Also recommend a trial with tonic water which has quinine, that has helped many of my patients. Daily stretching is also recommended I also offered possibly a muscle relaxant but she is  reluctant because in the past they cause some drowsiness. Wheezing: Reports random episodes of wheezing not associated w/ other sxs, no h/o asthma, lung exam today normal. Recommend observation.

## 2017-05-09 NOTE — Patient Instructions (Signed)
Take a magnesium tablet OTC 1 a day every day  Consider try a Tonic Water at night   Call if cramps are severe or persist

## 2017-08-21 ENCOUNTER — Other Ambulatory Visit: Payer: Self-pay | Admitting: Internal Medicine

## 2017-08-27 IMAGING — MG DIGITAL SCREENING BILATERAL MAMMOGRAM WITH CAD
4 series · 4 of 4 positions shown · non-contrast
Comparison: Previous exam(s).

CLINICAL DATA: Screening.

EXAM:
DIGITAL SCREENING BILATERAL MAMMOGRAM WITH CAD

[R MLO]
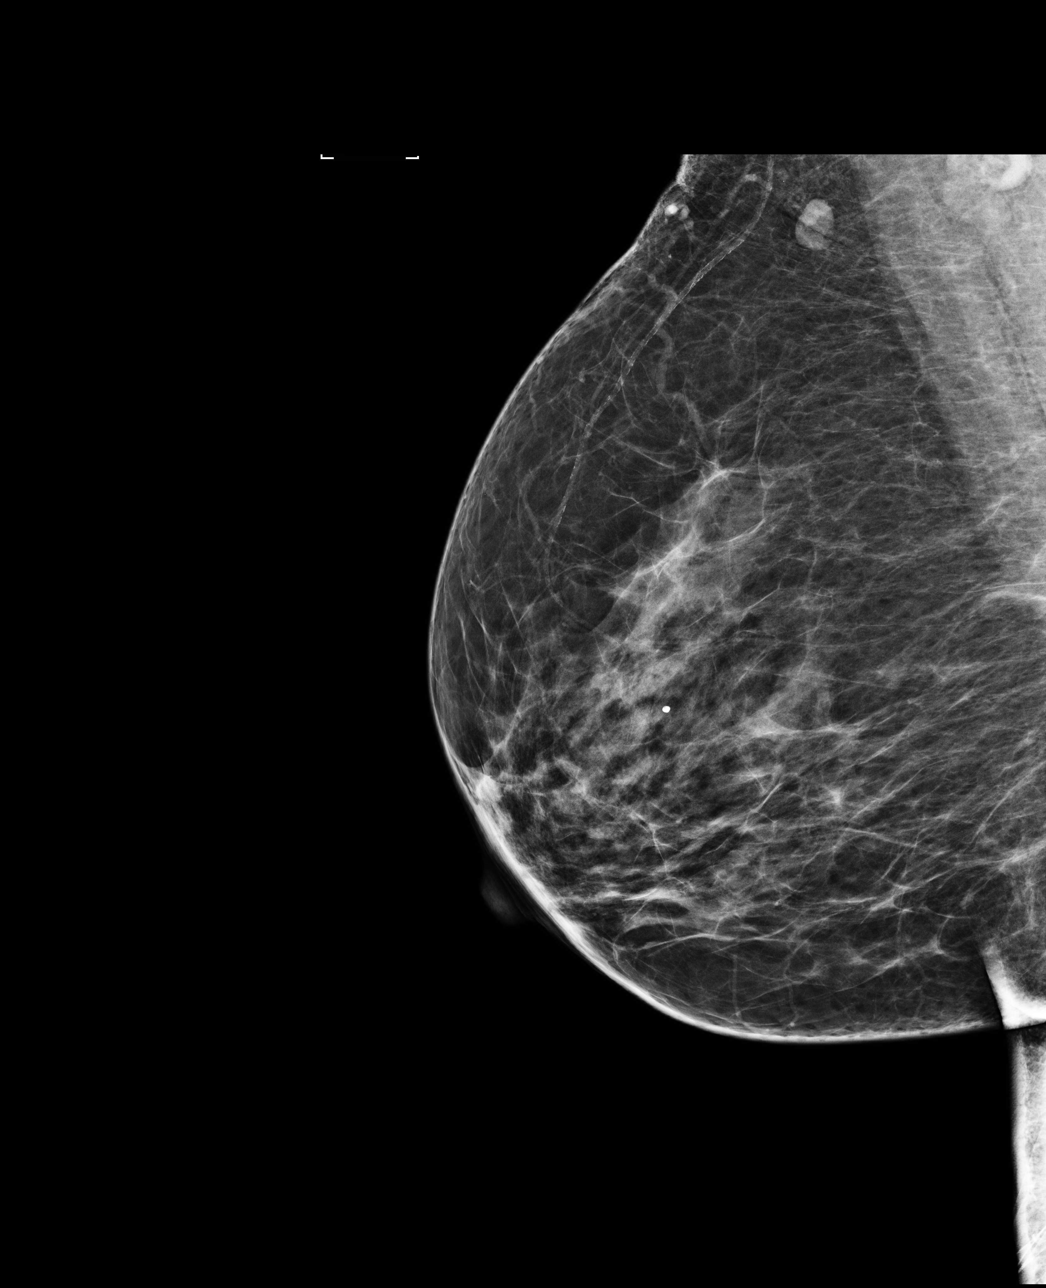

[R CC]
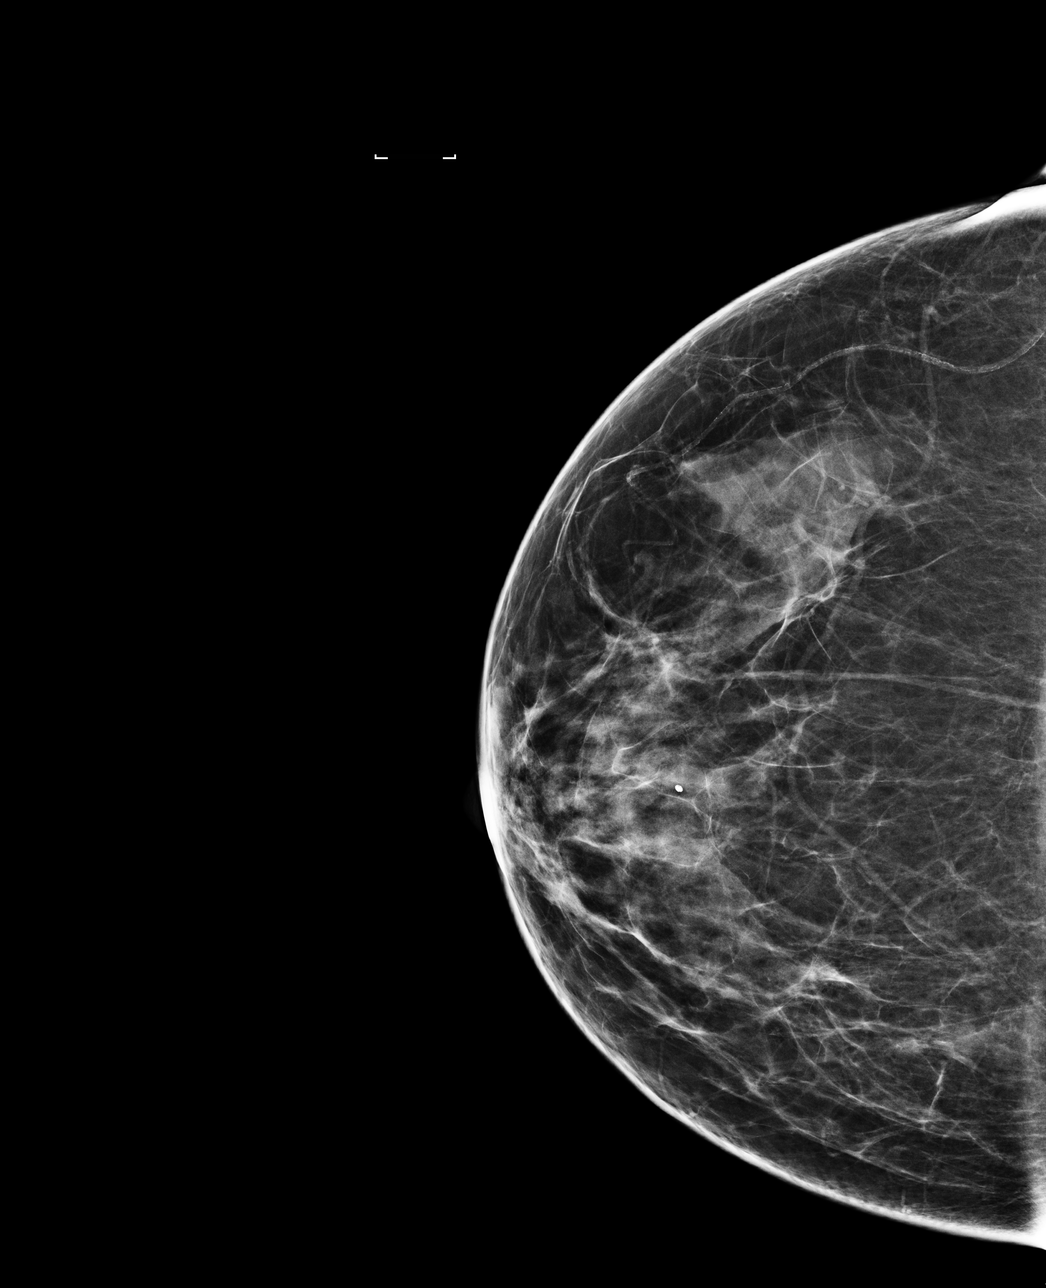

[L CC]
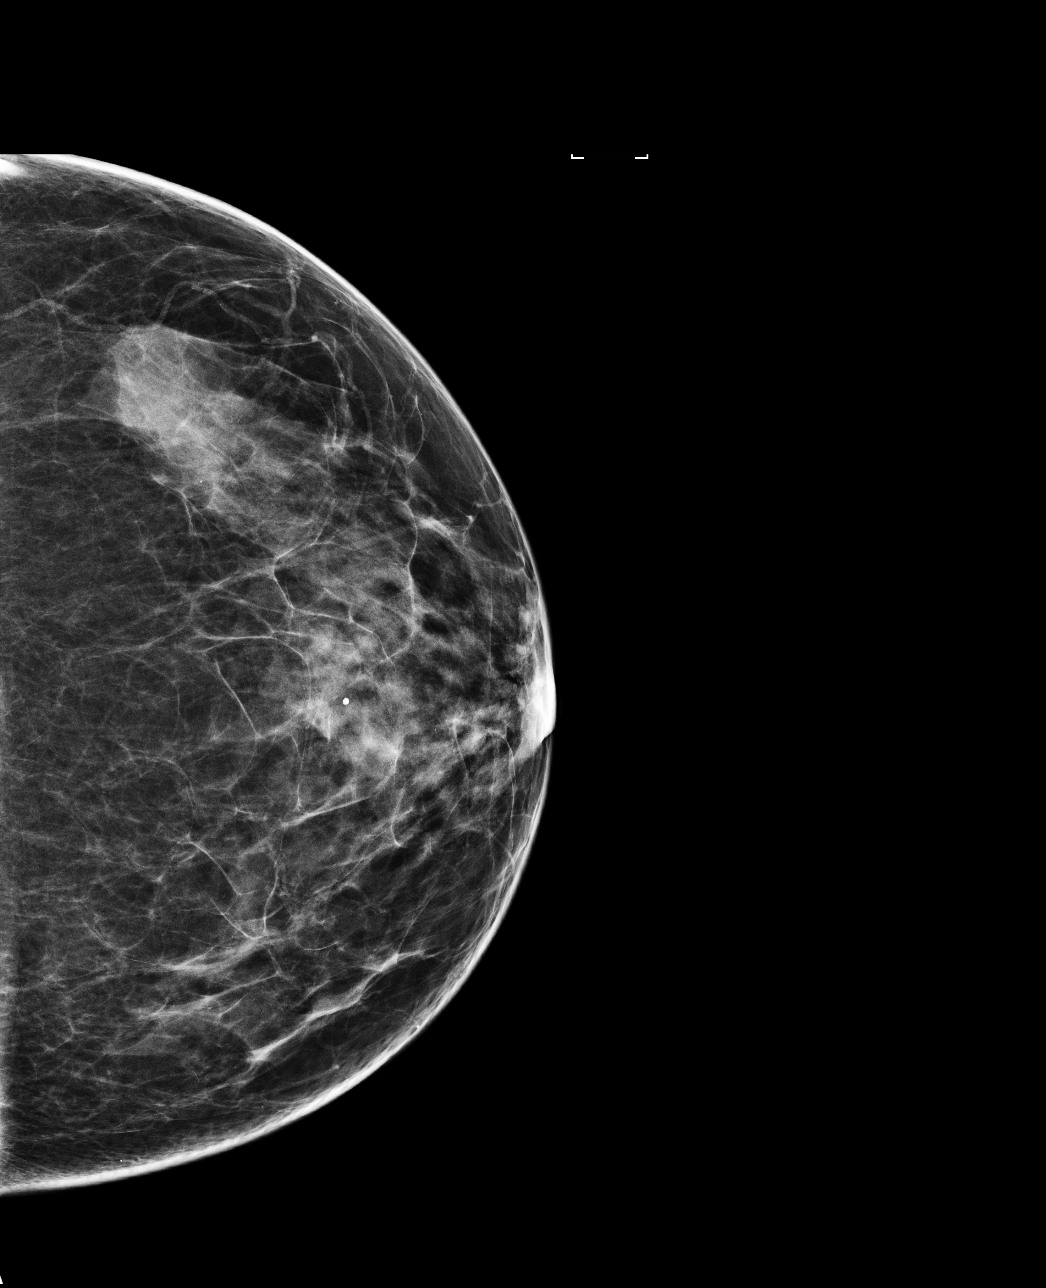

[L MLO]
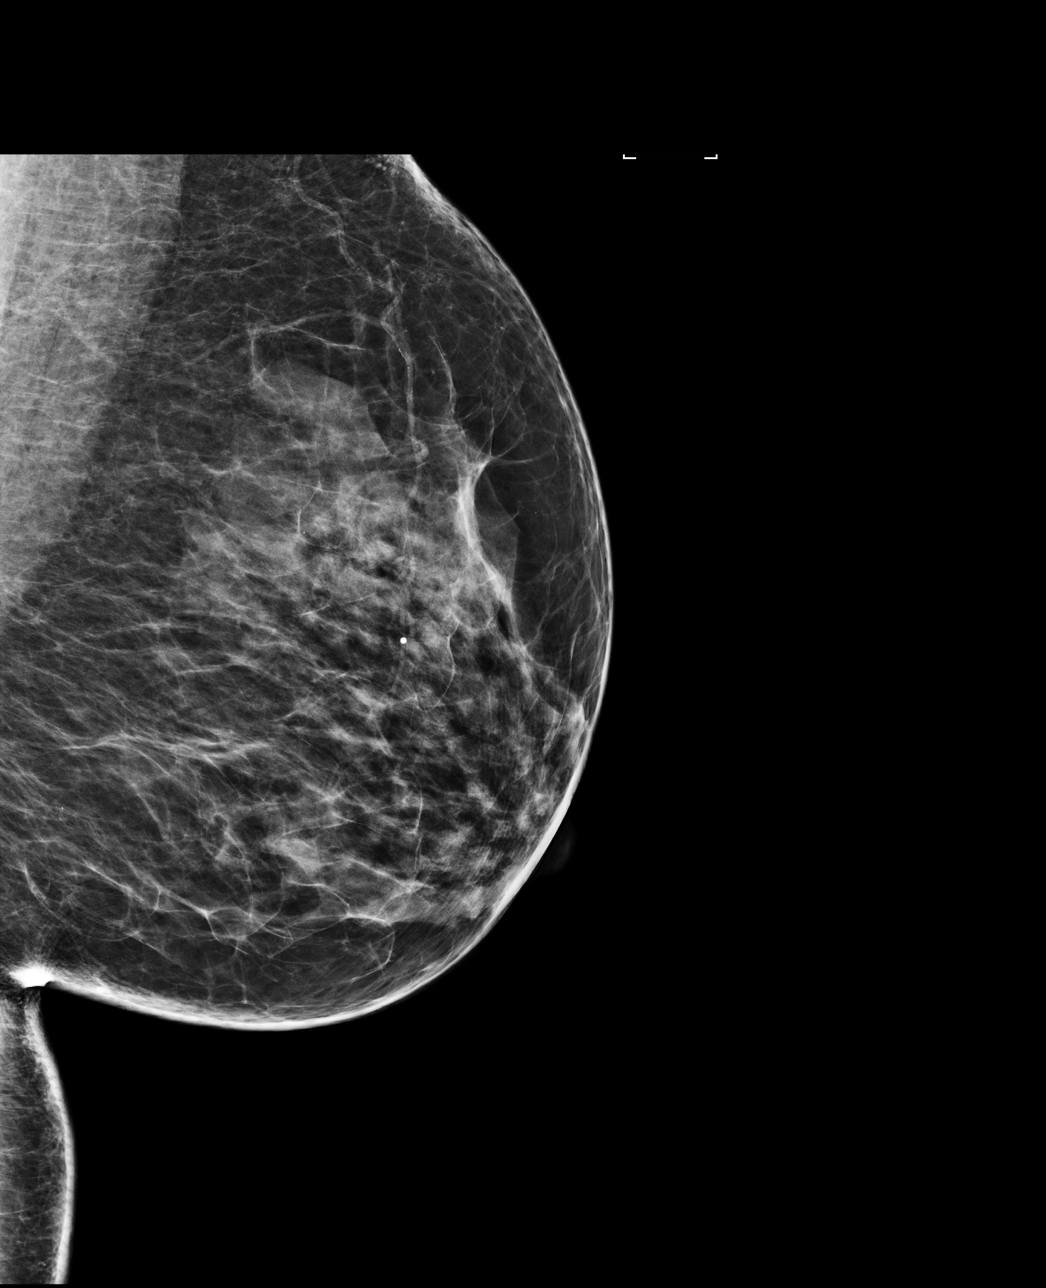

[4 of 4 positions shown; findings below may reference images not displayed]

ACR Breast Density Category c: The breast tissue is heterogeneously
dense, which may obscure small masses.
FINDINGS: There are no findings suspicious for malignancy. Images were
processed with CAD.
IMPRESSION: No mammographic evidence of malignancy. A result letter of this
screening mammogram will be mailed directly to the patient.

RECOMMENDATION:
Screening mammogram in one year. (Code:YJ-2-FEZ)

BI-RADS CATEGORY  1: Negative.

## 2017-09-21 ENCOUNTER — Other Ambulatory Visit: Payer: Self-pay

## 2017-09-21 MED ORDER — TRIAMTERENE-HCTZ 37.5-25 MG PO TABS: 1.0000 | ORAL_TABLET | Freq: Every day | ORAL | 0 refills | Status: AC

## 2017-09-26 ENCOUNTER — Ambulatory Visit: Payer: BLUE CROSS/BLUE SHIELD | Admitting: Internal Medicine

## 2017-09-26 ENCOUNTER — Encounter: Payer: Self-pay | Admitting: Internal Medicine

## 2017-09-26 VITALS — BP 132/80 | HR 67 | Temp 98.2°F | Resp 14 | Ht 65.0 in | Wt 188.0 lb

## 2017-09-26 DIAGNOSIS — E785 Hyperlipidemia, unspecified: Secondary | ICD-10-CM | POA: Diagnosis not present

## 2017-09-26 DIAGNOSIS — I10 Essential (primary) hypertension: Secondary | ICD-10-CM | POA: Diagnosis not present

## 2017-09-26 LAB — BASIC METABOLIC PANEL
BUN: 12 mg/dL (ref 6–23)
CALCIUM: 10.1 mg/dL (ref 8.4–10.5)
CO2: 31 mEq/L (ref 19–32)
Chloride: 102 mEq/L (ref 96–112)
Creatinine, Ser: 0.82 mg/dL (ref 0.40–1.20)
GFR: 91.01 mL/min (ref >60.00)
Glucose, Bld: 104 mg/dL — ABNORMAL HIGH (ref 70–99)
POTASSIUM: 3.5 meq/L (ref 3.5–5.1)
SODIUM: 141 meq/L (ref 135–145)

## 2017-09-26 NOTE — Progress Notes (Signed)
Subjective:    Patient ID: Anne Ramirez, female    DOB: Nov 10, 1955, 62 y.o.   MRN: [id number]  DOS:  09/26/2017 Type of visit - description : rov Interval history: HTN: Good compliance of medication, ambulatory BPs normal, one time since the last visit was 153/86. Mildly elevated cholesterol: Diet and exercise have no change, no time to exercise regularly Reports again occasional wheezing, maybe once a week, usually at rest, no associated chest pain, difficulty breathing or cough. Denies GERD symptoms or lower extremity edema Again reports itching at the skin under the breast, not really redness.  As long as she uses hydrocortisone the itching is not a problem.   Review of Systems  See above Past Medical History:  Diagnosis Date  . Back injury    2007  . Hyperlipidemia    h/o myalgias w/ crestor    Past Surgical History:  Procedure Laterality Date  . ABDOMINAL HYSTERECTOMY  1989   Hyst and oophorectomy , no malignancy  . HEMORRHOIDECTOMY WITH HEMORRHOID BANDING  2010    Social History   Socioeconomic History  . Marital status: Divorced    Spouse name: Not on file  . Number of children: 2  . Years of education: Not on file  . Highest education level: Not on file  Social Needs  . Financial resource strain: Not on file  . Food insecurity - worry: Not on file  . Food insecurity - inability: Not on file  . Transportation needs - medical: Not on file  . Transportation needs - non-medical: Not on file  Occupational History  . 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 Smoking status: Never Smoker  . Smokeless tobacco: Never Used  Substance and Sexual Activity  . Alcohol use: No    Alcohol/week: 0.0 oz  . Drug use: No  . Sexual activity: Not on file  Other Topics Concern  . 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 Always use your most recent med list.          cholecalciferol 1000 units tablet Commonly known as:  VITAMIN D Take 1,000 Units by mouth daily.   hydrocortisone 2.5 % cream Apply topically 2 (two) times daily.   potassium chloride 10 MEQ tablet Commonly known as:  KLOR-CON M10 Take 1 tablet (10 mEq total) by mouth daily.   triamterene-hydrochlorothiazide 37.5-25 MG tablet Commonly known as:  MAXZIDE-25 Take 1 tablet by mouth daily.          Objective:   Physical Exam BP 132/80 (BP Location: Left Arm, Patient Position: Sitting, Cuff Size: Small)   Pulse 67   Temp 98.2 F (36.8 C) (Oral)   Resp 14   Ht 5\' 5"  (1.651 m)   Wt 188 lb (85.3 kg)   SpO2 97%   BMI 31.28 kg/m  General:   Well developed, well nourished . NAD.  HEENT:  Normocephalic . Face symmetric, atraumatic Lungs:  CTA B Normal respiratory effort, no intercostal retractions, no accessory muscle use.  Heart: RRR,  no murmur.  No pretibial edema bilaterally  Skin: Skin under the breasts normal to inspection and palpation.  Specifically no redness or velvety appearance. Neurologic:  alert & oriented X3.  Speech normal, gait appropriate for age and unassisted Psych--  Cognition and judgment appear intact.  Cooperative with normal attention span and concentration.  Behavior appropriate. No anxious or depressed appearing.      Assessment & Plan:    Assessment Prediabetes HTN -- started HCTZ 02-2015 Hyperlipidemia ----  myalgias with Crestor Low Vit D dx 02-2015  Back injury 2007  Allergic reactions: Saw Dr. Maple HudsonYoung 06-2015 ( sx started before HCTZ)  PLAN:  HTN: Ambulatory BPs normal except x1 in the last few months.  Check a BMP, continue Maxide, potassium, continue monitoring BPs. Mildly elevated cholesterol:  Diet and exercise no change, patient education provided. Wheezing: lung exam normal today, denies lower extremity edema (doubt cardiac asthma).  Rec PFTs, patient declined. Skin irritation under the breasts: Patient again reports skin irritation, exam is normal, she seems to be quite bothered by the irritation and sweats.  Recommend the use of powder and continue hydrocortisone, she was not really satisfied with the advice, I rx dermatology referral, she declined  RTC 6 months CPX

## 2017-09-26 NOTE — Patient Instructions (Signed)
GO TO THE LAB : Get the blood work     GO TO THE FRONT DESK Schedule your next appointment for a  Physical (03-2018)    Check the  blood pressure 2 or 3 times a month   Be sure your blood pressure is between 110/65 and  135/85. If it is consistently higher or lower, let me know   GENERAL INFORMATION ABOUT HEALTHY EATING, CHOLESTEROL, HIGH BLOOD PRESSURE:  The American Heart Association, www.heart.org  Check the "Life's Simple 7" from the [facility] The American diabetes Association www.diabetes.org  "[facility] A to Z Health Guide" book

## 2017-09-26 NOTE — Progress Notes (Signed)
Pre visit review using our clinic review tool, if applicable. No additional management support is needed unless otherwise documented below in the visit note. 

## 2017-09-27 NOTE — Assessment & Plan Note (Signed)
HTN: Ambulatory BPs normal except x1 in the last few months.  Check a BMP, continue Maxide, potassium, continue monitoring BPs. Mildly elevated cholesterol: Diet and exercise no change, patient education provided. Wheezing: lung exam normal today, denies lower extremity edema (doubt cardiac asthma).  Rec PFTs, patient declined. Skin irritation under the breasts: Patient again reports skin irritation, exam is normal, she seems to be quite bothered by the irritation and sweats.  Recommend the use of powder and continue hydrocortisone, she was not really satisfied with the advice, I rx dermatology referral, she declined  RTC 6 months CPX

## 2018-03-20 ENCOUNTER — Ambulatory Visit (INDEPENDENT_AMBULATORY_CARE_PROVIDER_SITE_OTHER): Payer: BLUE CROSS/BLUE SHIELD | Admitting: Internal Medicine

## 2018-03-20 ENCOUNTER — Encounter: Payer: Self-pay | Admitting: Internal Medicine

## 2018-03-20 VITALS — BP 159/92 | HR 71 | Temp 98.5°F | Resp 16 | Ht 65.0 in | Wt 177.1 lb

## 2018-03-20 DIAGNOSIS — Z0000 Encounter for general adult medical examination without abnormal findings: Secondary | ICD-10-CM | POA: Diagnosis not present

## 2018-03-20 LAB — COMPREHENSIVE METABOLIC PANEL
ALT: 11 U/L (ref 0–35)
AST: 14 U/L (ref 0–37)
Albumin: 4.3 g/dL (ref 3.5–5.2)
Alkaline Phosphatase: 64 U/L (ref 39–117)
BUN: 9 mg/dL (ref 6–23)
CALCIUM: 9.7 mg/dL (ref 8.4–10.5)
CHLORIDE: 102 meq/L (ref 96–112)
CO2: 34 mEq/L — ABNORMAL HIGH (ref 19–32)
Creatinine, Ser: 0.8 mg/dL (ref 0.40–1.20)
GFR: 93.49 mL/min (ref >60.00)
Glucose, Bld: 102 mg/dL — ABNORMAL HIGH (ref 70–99)
POTASSIUM: 4.3 meq/L (ref 3.5–5.1)
Sodium: 141 mEq/L (ref 135–145)
Total Bilirubin: 0.6 mg/dL (ref 0.2–1.2)
Total Protein: 7.1 g/dL (ref 6.0–8.3)

## 2018-03-20 LAB — CBC WITH DIFFERENTIAL/PLATELET
BASOS PCT: 0.8 % (ref 0.0–3.0)
Basophils Absolute: 0.1 10*3/uL (ref 0.0–0.1)
EOS ABS: 0.2 10*3/uL (ref 0.0–0.7)
Eosinophils Relative: 2.4 % (ref 0.0–5.0)
HCT: 41.9 % (ref 36.0–46.0)
Hemoglobin: 13.8 g/dL (ref 12.0–15.0)
LYMPHS ABS: 3.4 10*3/uL (ref 0.7–4.0)
LYMPHS PCT: 53.3 % — AB (ref 12.0–46.0)
MCHC: 32.8 g/dL (ref 30.0–36.0)
MCV: 84.6 fl (ref 78.0–100.0)
MONO ABS: 0.4 10*3/uL (ref 0.1–1.0)
Monocytes Relative: 5.5 % (ref 3.0–12.0)
NEUTROS ABS: 2.5 10*3/uL (ref 1.4–7.7)
NEUTROS PCT: 38 % — AB (ref 43.0–77.0)
Platelets: 275 10*3/uL (ref 150.0–400.0)
RBC: 4.95 Mil/uL (ref 3.87–5.11)
RDW: 14.6 % (ref 11.5–15.5)
WBC: 6.5 10*3/uL (ref 4.0–10.5)

## 2018-03-20 LAB — LIPID PANEL
Cholesterol: 226 mg/dL — ABNORMAL HIGH (ref 0–200)
HDL: 48.4 mg/dL (ref >39.00)
LDL CALC: 145 mg/dL — AB (ref 0–99)
NonHDL: 177.76
TRIGLYCERIDES: 165 mg/dL — AB (ref 0.0–149.0)
Total CHOL/HDL Ratio: 5
VLDL: 33 mg/dL (ref 0.0–40.0)

## 2018-03-20 LAB — HEMOGLOBIN A1C: HEMOGLOBIN A1C: 6.2 % (ref 4.6–6.5)

## 2018-03-20 NOTE — Progress Notes (Signed)
Pre visit review using our clinic review tool, if applicable. No additional management support is needed unless otherwise documented below in the visit note. 

## 2018-03-20 NOTE — Progress Notes (Signed)
   Subjective:    Patient ID: Anne Ramirez, female    DOB: June 20, 1956, 62 y.o.   MRN: [id number]  DOS:  03/20/2018 To early for a CPX. Office visit cancelled We will get labs we will schedule a CPX in few days.

## 2018-04-09 ENCOUNTER — Ambulatory Visit (INDEPENDENT_AMBULATORY_CARE_PROVIDER_SITE_OTHER): Payer: BLUE CROSS/BLUE SHIELD | Admitting: Internal Medicine

## 2018-04-09 ENCOUNTER — Encounter: Payer: Self-pay | Admitting: Internal Medicine

## 2018-04-09 VITALS — BP 130/78 | HR 73 | Temp 98.3°F | Resp 16 | Ht 65.0 in | Wt 179.2 lb

## 2018-04-09 DIAGNOSIS — Z0000 Encounter for general adult medical examination without abnormal findings: Secondary | ICD-10-CM

## 2018-04-09 DIAGNOSIS — R7303 Prediabetes: Secondary | ICD-10-CM

## 2018-04-09 DIAGNOSIS — E785 Hyperlipidemia, unspecified: Secondary | ICD-10-CM

## 2018-04-09 DIAGNOSIS — E559 Vitamin D deficiency, unspecified: Secondary | ICD-10-CM

## 2018-04-09 DIAGNOSIS — I10 Essential (primary) hypertension: Secondary | ICD-10-CM | POA: Diagnosis not present

## 2018-04-09 DIAGNOSIS — Z01419 Encounter for gynecological examination (general) (routine) without abnormal findings: Secondary | ICD-10-CM

## 2018-04-09 DIAGNOSIS — Z1231 Encounter for screening mammogram for malignant neoplasm of breast: Secondary | ICD-10-CM

## 2018-04-09 NOTE — Patient Instructions (Signed)
   GO TO THE FRONT DESK Schedule your next appointment for a  Physical in 1 year  Take Vit D 1000 units daily   Check the  blood pressure monthly   Be sure your blood pressure is between 110/65 and  135/85. If it is consistently higher or lower, let me know

## 2018-04-09 NOTE — Assessment & Plan Note (Signed)
-  Tdap 2012; flu shot at work; shingrex discussed. -CCS: Colonoscopy 08/07/2008, no polyps; cscope again 2015, polyps, 5 years . Dr. Bosie ClosSchooler  -[facility] , no recent visits. Refer to Dr Ernestina PennaFogleman. H/o hysteroctomy 1989, no malignancy, last PAP 2013 (-) MMG 03-2017. Plans to have one this year -DEXA: concerned about cost, recommend to d/w her insurance , rec  vitamin D - recent labs reviewed with the patient Diet-exercise discussed

## 2018-04-09 NOTE — Progress Notes (Signed)
Subjective:    Patient ID: Anne PattenVelma D Ramirez, female    DOB: June 06, 1956, 62 y.o.   MRN: [id number]  DOS:  04/09/2018 Type of visit - description : cpx Interval history:  feeling well.   Review of Systems Occasionally has lower abdominal discomfort for when she sneezes very hard.  Denies LUTS, no vaginal bleeding or discharge.  Other than above, a 14 point review of systems is negative    Past Medical History:  Diagnosis Date  . Back injury    2007  . Hyperlipidemia    h/o myalgias w/ crestor    Past Surgical History:  Procedure Laterality Date  . ABDOMINAL HYSTERECTOMY  1989   Hyst and  ?? oophorectomy , no malignancy  . HEMORRHOIDECTOMY WITH HEMORRHOID BANDING  2010    Social History   Socioeconomic History  . Marital status: Divorced    Spouse name: Not on file  . Number of children: 2  . Years of education: Not on file  . Highest education level: Not on file  Occupational History  . 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 Financial resource strain: Not on file  . Food insecurity:    Worry: Not on file    Inability: Not on file  . Transportation needs:    Medical: Not on file    Non-medical: Not on file  Tobacco Use  . Smoking status: Never Smoker  . Smokeless tobacco: Never Used  Substance and Sexual Activity  . Alcohol use: No    Alcohol/week: 0.0 standard drinks  . Drug use: No  . Sexual activity: Not on file  Lifestyle  . Physical activity:    Days per week: Not on file    Minutes per session: Not on file  . Stress: Not on file  Relationships  . Social connections:    Talks on phone: Not on file    Gets together: Not on file    Attends religious service: Not on file    Active member of club or organization: Not on file    Attends meetings of clubs or organizations: Not on file    Relationship status: Not on file  . Intimate partner violence:    Fear of current or ex partner: Not on file    Emotionally abused: Not on file    Physically  abused: Not on file    Forced sexual activity: Not on file  Other Topics Concern  . 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 Kidney disease Brother   . Hyperlipidemia Maternal Grandmother   . Stroke Maternal Grandmother   . Hypertension Maternal Grandmother   . Hyperlipidemia Maternal Grandfather   . Stroke Maternal Grandfather   . Hypertension Maternal Grandfather   . Hyperlipidemia Paternal Grandmother   . Stroke Paternal Grandmother   . Hypertension Paternal Grandmother   . Hyperlipidemia Paternal Grandfather   . Stroke Paternal Grandfather   . Hypertension Paternal Grandfather   . Colon cancer Neg Hx   . Breast cancer Neg Hx      Allergies as of 04/09/2018      Reactions   Other Swelling   Sudden temperature change  Medication List        Accurate as of 04/09/18 11:59 PM. Always use your most recent med list.          cholecalciferol 1000 units tablet Commonly known as:  VITAMIN D Take 1,000 Units by mouth daily.   potassium chloride 10 MEQ tablet Commonly known as:  K-DUR,KLOR-CON Take 1 tablet (10 mEq total) by mouth daily.   triamterene-hydrochlorothiazide 37.5-25 MG tablet Commonly known as:  MAXZIDE-25 Take 1 tablet by mouth daily.          Objective:   Physical Exam BP 130/78 (BP Location: Left Arm, Patient Position: Sitting, Cuff Size: Small)   Pulse 73   Temp 98.3 F (36.8 C) (Oral)   Resp 16   Ht 5\' 5"  (1.651 m)   Wt 179 lb 4 oz (81.3 kg)   SpO2 96%   BMI 29.83 kg/m  General: Well developed, NAD, see BMI.  Neck: No  thyromegaly  HEENT:  Normocephalic . Face symmetric, atraumatic Lungs:  CTA B Normal respiratory effort, no intercostal retractions, no accessory muscle use. Heart: RRR,  no murmur.    No pretibial edema bilaterally  Abdomen:  Not distended, soft, non-tender. No rebound or rigidity.   Skin: Exposed areas without rash. Not pale. Not jaundice Neurologic:  alert & oriented X3.  Speech normal, gait appropriate for age and unassisted Strength symmetric and appropriate for age.  Psych: Cognition and judgment appear intact.  Cooperative with normal attention span and concentration.  Behavior appropriate. No anxious or depressed appearing.     Assessment & Plan:   Assessment Prediabetes HTN -- started HCTZ 02-2015 Hyperlipidemia ----  myalgias with Crestor Low Vit D dx 02-2015  Back injury 2007  Allergic reactions: Saw Dr. Maple Hudson 06-2015 ( sx started before HCTZ)  PLAN:  Prediabetes: Last A1c stable. HTN: Controlled, last BMP satisfactory Hyperlipidemia: Results reviewed, not willing to take medications, discussed diet and exercise. Low vitamin D: Encouraged supplements. RTC 1 year

## 2018-04-09 NOTE — Progress Notes (Signed)
Pre visit review using our clinic review tool, if applicable. No additional management support is needed unless otherwise documented below in the visit note. 

## 2018-04-10 NOTE — Assessment & Plan Note (Signed)
Prediabetes: Last A1c stable. HTN: Controlled, last BMP satisfactory Hyperlipidemia: Results reviewed, not willing to take medications, discussed diet and exercise. Low vitamin D: Encouraged supplements. RTC 1 year

## 2018-04-30 ENCOUNTER — Other Ambulatory Visit: Payer: Self-pay | Admitting: Internal Medicine

## 2018-04-30 DIAGNOSIS — Z1231 Encounter for screening mammogram for malignant neoplasm of breast: Secondary | ICD-10-CM

## 2018-05-20 ENCOUNTER — Telehealth: Payer: Self-pay

## 2018-05-20 NOTE — Telephone Encounter (Signed)
Copied from CRM [id number]. Topic: General - Other >> May 20, 2018  8:08 AM Trula Slade wrote: Reason for CRM:  Patient would like a call back concerning an appt and a future follow up appt.

## 2018-05-20 NOTE — Telephone Encounter (Signed)
Spoke w/ Pt- she is back in school and needing new health insurance through [employer]- she did not see PCP on plans she was planning to get- however the plan does have a form that can be completed by PCP. Instructed her to drop off form or fax to our office. Pt verbalized understanding.

## 2018-05-22 DIAGNOSIS — Z0279 Encounter for issue of other medical certificate: Secondary | ICD-10-CM

## 2018-05-23 ENCOUNTER — Telehealth: Payer: Self-pay

## 2018-05-23 NOTE — Telephone Encounter (Signed)
Pt's son dropped off form stating Nurse is aware he would be dropping it off. Put in Dr. Leta Jungling bin in the front office. Please fax form and call patient once it has been done.

## 2018-05-23 NOTE — Telephone Encounter (Signed)
Copied from CRM [id number]. Topic: General - Inquiry >> May 23, 2018  7:20 AM Windy Kalata, NT wrote: Reason for CRM: patient states she has tried faxing something to the office since yesterday 05/22/18 and the fax is saying busy. She has tried [phone] and [phone]. She would like a call back from someone within the office.

## 2018-05-23 NOTE — Telephone Encounter (Signed)
Pt's son dropped off form today.

## 2018-05-27 NOTE — Telephone Encounter (Signed)
Form received/SLS 11/08

## 2018-05-28 ENCOUNTER — Telehealth: Payer: Self-pay | Admitting: *Deleted

## 2018-05-28 NOTE — Telephone Encounter (Signed)
Opened in Error. Note already open RE: paperwork/SLS

## 2018-05-28 NOTE — Telephone Encounter (Signed)
Forwarded [employer] paperwork to [employer]

## 2018-06-05 ENCOUNTER — Ambulatory Visit: Payer: Self-pay

## 2018-06-11 ENCOUNTER — Ambulatory Visit
Admission: RE | Admit: 2018-06-11 | Discharge: 2018-06-11 | Disposition: A | Discharge status: Home / Self Care | Payer: BLUE CROSS/BLUE SHIELD | Source: Ambulatory Visit | Attending: Internal Medicine | Admitting: Internal Medicine

## 2018-06-11 DIAGNOSIS — Z1231 Encounter for screening mammogram for malignant neoplasm of breast: Secondary | ICD-10-CM

## 2018-06-11 NOTE — Telephone Encounter (Signed)
Form completed and faxed to [employer] at [phone]. Informed Pt that form has been completed and faxed. Form sent for scanning.

## 2019-04-16 ENCOUNTER — Encounter: Payer: Self-pay | Admitting: Internal Medicine

## 2022-03-17 ENCOUNTER — Telehealth: Payer: Self-pay | Admitting: Internal Medicine

## 2022-03-17 NOTE — Telephone Encounter (Signed)
She is aware of the appointment with Dr. Maple Hudson. She was appreciative of the call. Nothing further needed.

## 2022-04-12 NOTE — Progress Notes (Unsigned)
HPI female never smoker followed for Urticaria(cold), complicated by anxiety, HTN, Hyperglycemia, Hyperlipidemia Allergy Profile negative, food profile mild elevations only for shrimp and milk with total IgE 76, sedimentation rate 15, ANA negative, CRP low 0.2, CBC with differential normal, angioedema panel negative. LFTs have been normal as of 02/2015. Has had BCG vaccine  ===================================================================  07/14/15- 21 yoF Referred by Dr Larose Kells. Pt states breaking out in hives and blisters in August 2016 after gardening outside. Pt states ongoing allergy problems like swollen eyes, skin itching, and joint swelling since living in [place] an episode of swelling around the eyes and a swollen finger while in [place] in the 1980s. Later in [place] she noticed soles of feet were swollen. In [place] she had periorbital edema. She tried antihistamine for itching of eyes. These are all episodic and not clearly seasonal except that cold temperatures may be an important trigger. Temperature change causes hoarseness. Claritin has been some help. One episode of blebs and vesicles on her skin while blood pressure was very high. Picture she shows me suggest that she had ankle edema at that time recent emergency room visit for similar complaints with response to prednisone taper. Occasionally hands get really cold. Can't tell about blanching or discoloration to suggest Raynaud's phenomena. She has not been on ACE inhibitors. Denies history of asthma, seasonal pollen allergic rhinitis or food allergies. Family-son has persistent dermatitis and others have asthma and rhinitis. Has had BCG vaccine  11/15/2015-66 year old female never smoker followed for urticaria,  problems with allergy.  feels like her head is going to bust.  she stated that she is having chills .  Allergy labs 07/14/2015 -Allergy Profile negative, food profile mild elevations only for shrimp and milk with  total IgE 76, sedimentation rate 15, ANA negative, CRP low 0.2, CBC with differential normal, angioedema panel negative. LFTs have been normal as of 02/2015.  04/18/2016 -66 year old female never smoker followed for Urticaria, complicated by anxiety FOLLOWS FOR: Pt states since into the office this morning her voice is starting to get raspy-can tell a difference in the air in exam room vs lobby. Pt states otherwise she has done well with hives and rash. She says she got rash on both feet after taking Pepcid once, so assumed that was the cause and has not taken it again. No more rash or urticaria. Feels very sensitive to drafts and cool air. With exposure she feels some prickling in her throat and skin. Took prednisone 10 mg once for this feeling. It helped but she got anxious when her heart rate went up a little bit.  04/13/22- Coming to re-establish- 86 yoF never smoker ollowed for Urticaria(cold), complicated by anxiety, HTN, Hyperglycemia, Hyperlipidemia    ROS-see HPI   + = positive Constitutional:    weight loss, night sweats, fevers, chills, fatigue, lassitude. HEENT:    headaches, difficulty swallowing, tooth/dental problems, sore throat,       sneezing, itching, ear ache, nasal congestion, post nasal drip, snoring CV:    chest pain, orthopnea, PND, swelling in lower extremities, anasarca,  dizziness, palpitations Resp:   shortness of breath with exertion or at rest.                productive cough,   non-productive cough, coughing up of blood.              change in color of mucus.  wheezing.   Skin:    rash or lesions. GI:  No-  heartburn, indigestion, abdominal pain, nausea, vomiting, diarrhea,                 change in bowel habits, loss of appetite GU: dysuria, change in color of urine, no urgency or frequency.   flank pain. MS:   joint pain, stiffness, decreased range of motion, back pain. Neuro-     nothing unusual Psych:  change in mood or affect.  depression or anxiety.    memory loss.  OBJ- Physical Exam General- Alert, Oriented, Affect-appropriate, Distress- none acute. + On first my arrival to exam room she was sitting with head down and voice was a little raspy. With distraction she sat up and spoke normally. Skin- rash-none, lesions- none, excoriation- none Lymphadenopathy- none Head- atraumatic            Eyes- Gross vision intact, PERRLA, conjunctivae and secretions clear            Ears- Hearing, canals-normal            Nose- `, no-Septal dev, + mucus, polyps, erosion, perforation             Throat- Mallampati III-IV, mucosa clear , drainage- none, tonsils- atrophic,+ hoarse, torus Neck- flexible , trachea midline, no stridor , thyroid nl, carotid no bruit Chest - symmetrical excursion , unlabored           Heart/CV- RRR , no murmur , no gallop  , no rub, nl s1 s2                           - JVD- none , edema- none, stasis changes- none, varices- none           Lung- clear to P&A, wheeze- none, cough- none , dullness-none, rub- none           Chest wall-  Abd-  Br/ Gen/ Rectal- Not done, not indicated Extrem- cyanosis- none, clubbing, none, atrophy- none, strength- nl Neuro- grossly intact to observation

## 2022-04-13 ENCOUNTER — Encounter: Payer: Self-pay | Admitting: Internal Medicine

## 2022-04-13 ENCOUNTER — Ambulatory Visit: Payer: Managed Care, Other (non HMO) | Admitting: Internal Medicine

## 2022-04-13 VITALS — BP 136/82 | HR 70 | Ht 65.0 in | Wt 173.0 lb

## 2022-04-13 DIAGNOSIS — J4520 Mild intermittent asthma, uncomplicated: Secondary | ICD-10-CM | POA: Diagnosis not present

## 2022-04-13 DIAGNOSIS — L502 Urticaria due to cold and heat: Secondary | ICD-10-CM

## 2022-04-13 LAB — CBC WITH DIFFERENTIAL/PLATELET
Basophils Absolute: 0.1 10*3/uL (ref 0.0–0.1)
Basophils Relative: 1.1 % (ref 0.0–3.0)
Eosinophils Absolute: 0.2 10*3/uL (ref 0.0–0.7)
Eosinophils Relative: 3.2 % (ref 0.0–5.0)
HCT: 40.6 % (ref 36.0–46.0)
Hemoglobin: 13.4 g/dL (ref 12.0–15.0)
Lymphocytes Relative: 57.3 % — ABNORMAL HIGH (ref 12.0–46.0)
Lymphs Abs: 3.3 10*3/uL (ref 0.7–4.0)
MCHC: 33.1 g/dL (ref 30.0–36.0)
MCV: 85.6 fl (ref 78.0–100.0)
Monocytes Absolute: 0.4 10*3/uL (ref 0.1–1.0)
Monocytes Relative: 7.3 % (ref 3.0–12.0)
Neutro Abs: 1.8 10*3/uL (ref 1.4–7.7)
Neutrophils Relative %: 31.1 % — ABNORMAL LOW (ref 43.0–77.0)
Platelets: 276 10*3/uL (ref 150.0–400.0)
RBC: 4.74 Mil/uL (ref 3.87–5.11)
RDW: 14.3 % (ref 11.5–15.5)
WBC: 5.8 10*3/uL (ref 4.0–10.5)

## 2022-04-13 LAB — SEDIMENTATION RATE: Sed Rate: 7 mm/hr (ref 0–30)

## 2022-04-13 LAB — C-REACTIVE PROTEIN: CRP: <1 mg/dL (ref 0.5–20.0)

## 2022-04-13 NOTE — Patient Instructions (Signed)
Order- lab CBC w diff, IgE, Sed rate, C-reactive protein(CRP),        dx cold urticaria  Suggest otc antihistamine as needed- Claritin, Zyrtec or Allegra  Script sent for albuterol ha inhaler- inhale 2 puffs every 4 hours if needed for wheezing, chest tightness, shortness of breath

## 2022-04-13 NOTE — Assessment & Plan Note (Signed)
We discussed rescue inhaler and will send prescription for generic albuterol HFA.

## 2022-04-13 NOTE — Assessment & Plan Note (Signed)
Strong impression that there is a functional component to her complaints.  No visible rash currently. Plan- Labs for CRP, sed rate, IgE, eosinophil count.  Tries nonsedating antihistamine such as Claritin.  Because of history of possible reaction to Medrol Dosepak I will give her Depo-Medrol today.  She has some leftover prednisone.

## 2022-04-14 LAB — IGE: IgE (Immunoglobulin E), Serum: 102 kU/L (ref <=114)

## 2022-07-20 ENCOUNTER — Ambulatory Visit: Payer: Managed Care, Other (non HMO) | Admitting: Internal Medicine

## 2022-07-22 NOTE — Progress Notes (Signed)
HPI female never smoker followed for Urticaria(cold), complicated by anxiety, HTN, Hyperglycemia, Hyperlipidemia Allergy Profile negative, food profile mild elevations only for shrimp and milk with total IgE 76, sedimentation rate 15, ANA negative, CRP low 0.2, CBC with differential normal, angioedema panel negative. LFTs have been normal as of 02/2015. Has had BCG vaccine Lab 04/13/22-CBC diff ok, IgE 102 ok, Sed rate 7, CRP low< 1 ===================================================================   04/13/22- Coming to re-establish- 42 yoF never smoker followed for Urticaria(cold), complicated by anxiety, HTN, Hyperglycemia, Hyperlipidemia She describes periorbital puffiness, malaise, headaches.  Clearest association is with exposure to cold air.  Symptoms have been intermittent for many years.  Claritin may have helped in the past. Multiple medication intolerance-statins, antituberculous drugs, Pepcid, Medrol Dosepak all thought to cause malaise, pretibially pain, hoarseness, may be some rash. May wheeze at times.  ProAir inhaler was not covered by her insurance.  She has taken fractions of 5 mg left over prednisone pills when she thought she needed.  For headache she takes one half of a 500 mg Tylenol.  Any more than that makes her drowsy.  07/25/22-  66 yoF never smoker followed for Urticaria(cold), Asthma, complicated by anxiety, HTN, Hyperglycemia, Hyperlipidemia Multiple medication intolerance-statins, antituberculous drugs, Pepcid, Medrol Dosepak all thought to cause malaise, pretibial pain, hoarseness, maybe some rash. Lab 04/13/22-CBC diff ok, IgE 102 ok, Sed rate 7, CRP low< 1 -----Pt states due to the weather she is feeling hoarse and is wheezing when breathing out occasionally  Covid vax- 2 Moderna, 1 Phizer Flu vax- had Malaise without angioedema or urticaria, attributed to cold, wet weather today.  She dislikes coffee. I suggested trial of caffeine tab for energy if needed. Can provide  albuterol rescue inhaler for prn use- consider mild intermittent asthma.  ROS-see HPI   + = positive Constitutional:    weight loss, night sweats, fevers, chills, fatigue, lassitude. HEENT:    headaches, difficulty swallowing, tooth/dental probl ems, sore throat,       sneezing, itching, ear ache, nasal congestion, post nasal drip, snoring CV:    chest pain, orthopnea, PND, swelling in lower extremities, anasarca,  dizziness, palpitations Resp:   shortness of breath with exertion or at rest.                productive cough,   non-productive cough, coughing up of blood.              change in color of mucus.  +wheezing.   Skin:    rash or lesions. GI:  No-   heartburn, indigestion, abdominal pain, nausea, vomiting, diarrhea,                 change in bowel habits, loss of appetite GU: dysuria, change in color of urine, no urgency or frequency.   flank pain. MS:   joint pain, stiffness, decreased range of motion, back pain. Neuro-     nothing unusual Psych:  change in mood or affect.  depression or anxiety.   memory loss.  OBJ- Physical Exam General- Alert, Oriented, Affect-appropriate, Distress- none acute. +voice was a little raspy.  Skin- rash-none, lesions- none, excoriation- none Lymphadenopathy- none Head- atraumatic            Eyes- Gross vision intact, PERRLA, conjunctivae and secretions clear            Ears- Hearing, canals-normal            Nose-   no-Septal dev, + mucus, polyps, erosion, perforation  Throat- Mallampati III-IV, mucosa clear , drainage- none, tonsils- atrophic, torus Neck- flexible , trachea midline, no stridor , thyroid nl, carotid no bruit Chest - symmetrical excursion , unlabored           Heart/CV- RRR , no murmur , no gallop  , no rub, nl s1 s2                           - JVD- none , edema- none, stasis changes- none, varices- none           Lung- clear to P&A, wheeze- none, cough- none , dullness-none, rub- none           Chest wall-  Abd-   Br/ Gen/ Rectal- Not done, not indicated Extrem- cyanosis- none, clubbing, none, atrophy- none, strength- nl Neuro- grossly intact to observation

## 2022-07-25 ENCOUNTER — Ambulatory Visit: Payer: Managed Care, Other (non HMO) | Admitting: Internal Medicine

## 2022-07-25 ENCOUNTER — Encounter: Payer: Self-pay | Admitting: Internal Medicine

## 2022-07-25 VITALS — BP 132/80 | HR 61 | Ht 65.0 in | Wt 178.2 lb

## 2022-07-25 DIAGNOSIS — J4520 Mild intermittent asthma, uncomplicated: Secondary | ICD-10-CM

## 2022-07-25 DIAGNOSIS — L502 Urticaria due to cold and heat: Secondary | ICD-10-CM

## 2022-07-25 MED ORDER — ALBUTEROL SULFATE HFA 108 (90 BASE) MCG/ACT IN AERS: 2.0000 | INHALATION_SPRAY | Freq: Four times a day (QID) | RESPIRATORY_TRACT | 6 refills | Status: DC | PRN

## 2022-07-25 NOTE — Patient Instructions (Addendum)
For energy if needed- consider trying an otc caffeine tablet like Vivarin, NoDoz or store brand. You can break in half and try that first.  Script sent to drug store to try an albuterol inhaler for wheezing or dificulty breathing if needed               Inhale 2 puffs every 6 hours as needed  Please call if we can  help

## 2022-07-25 NOTE — Assessment & Plan Note (Signed)
Doesn't seem to get urticaria or angioedema anymore, just complains of fatigue and malaise in cold. Discussed trial of caffeine tab for fatigue if needed. Stay warm.

## 2022-07-25 NOTE — Assessment & Plan Note (Signed)
Educated use of albuterol hfa rescue inhaler for trial Plan- albuterol hfa

## 2023-07-25 NOTE — Progress Notes (Signed)
 HPI female never smoker followed for Urticaria(cold), complicated by anxiety, HTN, Hyperglycemia, Hyperlipidemia Allergy  Profile negative, food profile mild elevations only for shrimp and milk with total IgE 76, sedimentation rate 15, ANA negative, CRP low 0.2, CBC with differential normal, angioedema panel negative. LFTs have been normal as of 02/2015. Has had BCG vaccine Lab 04/13/22-CBC diff ok, IgE 102 ok, Sed rate 7, CRP low< 1 ===================================================================   07/25/22-  66 yoF never smoker followed for Urticaria(cold), Asthma, complicated by anxiety, HTN, Hyperglycemia, Hyperlipidemia Multiple medication intolerance-statins, antituberculous drugs, Pepcid, Medrol  Dosepak all thought to cause malaise, pretibial pain, hoarseness, maybe some rash. Lab 04/13/22-CBC diff ok, IgE 102 ok, Sed rate 7, CRP low< 1 -----Pt states due to the weather she is feeling hoarse and is wheezing when breathing out occasionally  Covid vax- 2 Moderna, 1 Phizer Flu vax- had Malaise without angioedema or urticaria, attributed to cold, wet weather today.  She dislikes coffee. I suggested trial of caffeine tab for energy if needed. Can provide albuterol  rescue inhaler for prn use- consider mild intermittent asthma.  07/26/23-  67 yoF never smoker followed for Urticaria(cold), Asthma, complicated by anxiety, HTN, Hyperglycemia, Hyperlipidemia Multiple medication intolerance-statins, antituberculous drugs, Pepcid, Medrol  Dosepak all thought to cause malaise, pretibial pain, hoarseness, maybe some rash. -Ventolin  hfa,  Shows pictures of bullous rash attributed to med reactions. We are going to sample Trelegy, trying to clarify if this affects her mild, intermittent wheeze. Discussed the use of AI scribe software for clinical note transcription with the patient, who gave verbal consent to proceed  History of Present Illness   The patient presents with intermittent wheezing and itching.  The wheezing is not associated with shortness of breath or exertional dyspnea. She has been using an albuterol  inhaler occasionally, which provides some relief. She has not identified any specific triggers for the wheezing.  The patient also reports a persistent itching sensation in her left hand and arm, which has been ongoing for the past year. The itching is sometimes accompanied by a tingling sensation. She has not noticed any visible skin changes associated with the itching. She is scheduled to see a neurologist for further evaluation of these symptoms.  In addition, the patient describes muscle spasms and cramping in various parts of her body, including her back and thighs. These spasms can be triggered by certain movements or stretches.  The patient has a history of high blood pressure, which is currently managed with Maxzide 25. She also reports a history of allergic reactions to certain medications, including statins and Pepcid, which have caused skin blistering. Cold causes some swelling around eyes and change in voice, which she controls by dressing warmly.      ROS-see HPI   + = positive Constitutional:    weight loss, night sweats, fevers, chills, fatigue, lassitude. HEENT:    headaches, difficulty swallowing, tooth/dental probl ems, sore throat,       sneezing, itching, ear ache, nasal congestion, post nasal drip, snoring CV:    chest pain, orthopnea, PND, swelling in lower extremities, anasarca,  dizziness, palpitations Resp:   shortness of breath with exertion or at rest.                productive cough,   non-productive cough, coughing up of blood.              change in color of mucus.  +wheezing.   Skin:    rash or lesions. GI:  No-   heartburn, indigestion, abdominal pain,  nausea, vomiting, diarrhea,                 change in bowel habits, loss of appetite GU: dysuria, change in color of urine, no urgency or frequency.   flank pain. MS:   joint pain, stiffness, decreased  range of motion, back pain. Neuro-     nothing unusual Psych:  change in mood or affect.  depression or anxiety.   memory loss.  OBJ- Physical Exam General- Alert, Oriented, Affect-appropriate, Distress- none acute. Skin- rash-none, lesions- none, excoriation- none Lymphadenopathy- none Head- atraumatic            Eyes- Gross vision intact, PERRLA, conjunctivae and secretions clear            Ears- Hearing, canals-normal            Nose-   no-Septal dev, + mucus, polyps, erosion, perforation             Throat- Mallampati III-IV, mucosa clear , drainage- none, tonsils- atrophic, torus Neck- flexible , trachea midline, no stridor , thyroid nl, carotid no bruit Chest - symmetrical excursion , unlabored           Heart/CV- RRR , no murmur , no gallop  , no rub, nl s1 s2                           - JVD- none , edema- none, stasis changes- none, varices- none           Lung- clear to P&A, wheeze- none, cough- none , dullness-none, rub- none           Chest wall-  Abd-  Br/ Gen/ Rectal- Not done, not indicated Extrem- cyanosis- none, clubbing, none, atrophy- none, strength- nl Neuro- grossly intact to observation  Assessment and Plan    Wheezing Intermittent mild wheezing, not associated with shortness of breath or daily symptoms. No current wheezing on examination. -Continue using Albuterol  inhaler as needed. -Trial of Trelegy 100 inhaler once daily for 7-10 days. Rinse mouth after use.  Pruritus and Tingling of Left Arm Unexplained itching and tingling of the left arm, mainly thumb, up narrow stipe to L shoulder. Possible nerve impingement in the shoulder or neck. -She has appointment to neurologist for further evaluation.  Multiple Drug Allergies Reported allergic reactions to multiple medications including Pepcid, statins, and an unspecified blood pressure medication. Reactions include blistering and urticaria. -Document allergies and avoid these medications in the  future.  Hypertension Managed with Maxzide 25mg . -Continue current regimen.  Muscle Spasms Reports of muscle spasms in various locations including back and thighs. -Report symptoms to neurologist during upcoming visit.

## 2023-07-26 ENCOUNTER — Ambulatory Visit (INDEPENDENT_AMBULATORY_CARE_PROVIDER_SITE_OTHER): Payer: Managed Care, Other (non HMO) | Admitting: Internal Medicine

## 2023-07-26 ENCOUNTER — Encounter: Payer: Self-pay | Admitting: Internal Medicine

## 2023-07-26 VITALS — BP 116/80 | HR 70 | Ht 65.0 in | Wt 180.4 lb

## 2023-07-26 DIAGNOSIS — J4520 Mild intermittent asthma, uncomplicated: Secondary | ICD-10-CM

## 2023-07-26 NOTE — Patient Instructions (Signed)
 Order- sample x 1 Trelegy 100 inhaler   inhale 1 puff, then rinse mouth. Do this one time daily to use up the sample. See if it helps prevent wheezing or shortness of breath,.

## 2023-11-25 NOTE — Progress Notes (Unsigned)
 HPI female never smoker followed for Urticaria(cold), complicated by anxiety, HTN, Hyperglycemia, Hyperlipidemia Allergy  Profile negative, food profile mild elevations only for shrimp and milk with total IgE 76, sedimentation rate 15, ANA negative, CRP low 0.2, CBC with differential normal, angioedema panel negative. LFTs have been normal as of 02/2015. Has had BCG vaccine Lab 04/13/22-CBC diff ok, IgE 102 ok, Sed rate 7, CRP low< 1 ===================================================================   07/25/22-  66 yoF never smoker followed for Urticaria(cold), Asthma, complicated by anxiety, HTN, Hyperglycemia, Hyperlipidemia Multiple medication intolerance-statins, antituberculous drugs, Pepcid, Medrol  Dosepak all thought to cause malaise, pretibial pain, hoarseness, maybe some rash. Lab 04/13/22-CBC diff ok, IgE 102 ok, Sed rate 7, CRP low< 1 -----Pt states due to the weather she is feeling hoarse and is wheezing when breathing out occasionally  Covid vax- 2 Moderna, 1 Phizer Flu vax- had Malaise without angioedema or urticaria, attributed to cold, wet weather today.  She dislikes coffee. I suggested trial of caffeine tab for energy if needed. Can provide albuterol  rescue inhaler for prn use- consider mild intermittent asthma.  07/26/23-  67 yoF never smoker followed for Urticaria(cold), Asthma, complicated by anxiety, HTN, Hyperglycemia, Hyperlipidemia Multiple medication intolerance-statins, antituberculous drugs, Pepcid, Medrol  Dosepak all thought to cause malaise, pretibial pain, hoarseness, maybe some rash. -Ventolin  hfa,  Shows pictures of bullous rash attributed to med reactions. We are going to sample Trelegy, trying to clarify if this affects her mild, intermittent wheeze. Discussed the use of AI scribe software for clinical note transcription with the patient, who gave verbal consent to proceed  History of Present Illness   The patient presents with intermittent wheezing and itching.  The wheezing is not associated with shortness of breath or exertional dyspnea. She has been using an albuterol  inhaler occasionally, which provides some relief. She has not identified any specific triggers for the wheezing.  The patient also reports a persistent itching sensation in her left hand and arm, which has been ongoing for the past year. The itching is sometimes accompanied by a tingling sensation. She has not noticed any visible skin changes associated with the itching. She is scheduled to see a neurologist for further evaluation of these symptoms.  In addition, the patient describes muscle spasms and cramping in various parts of her body, including her back and thighs. These spasms can be triggered by certain movements or stretches.  The patient has a history of high blood pressure, which is currently managed with Maxzide 25. She also reports a history of allergic reactions to certain medications, including statins and Pepcid, which have caused skin blistering. Cold causes some swelling around eyes and change in voice, which she controls by dressing warmly.   Assessment and Plan:    Wheezing Intermittent mild wheezing, not associated with shortness of breath or daily symptoms. No current wheezing on examination. -Continue using Albuterol  inhaler as needed. -Trial of Trelegy 100 inhaler once daily for 7-10 days. Rinse mouth after use.  Pruritus and Tingling of Left Arm Unexplained itching and tingling of the left arm, mainly thumb, up narrow stipe to L shoulder. Possible nerve impingement in the shoulder or neck. -She has appointment to neurologist for further evaluation.  Multiple Drug Allergies Reported allergic reactions to multiple medications including Pepcid, statins, and an unspecified blood pressure medication. Reactions include blistering and urticaria. -Document allergies and avoid these medications in the future.  Hypertension Managed with Maxzide 25mg . -Continue current  regimen.  Muscle Spasms Reports of muscle spasms in various locations including back and thighs. -  Report symptoms to neurologist during upcoming visit.   11/26/23-  67 yoF never smoker followed for Urticaria(cold), Asthma, complicated by anxiety, HTN, Hyperglycemia, Hyperlipidemia Multiple medication intolerance-statins, antituberculous drugs, Pepcid, Medrol  Dosepak all thought to cause malaise, pretibial pain, hoarseness, maybe some rash  Discussed the use of AI scribe software for clinical note transcription with the patient, who gave verbal consent to proceed.  History of Present Illness   Anne Ramirez is a 68 year old female with asthma and urticaria who presents for follow-up of her respiratory symptoms.  She experiences wheezing occasionally, with three episodes since her last visit, often triggered by environmental factors like rain. She uses her albuterol  rescue inhaler as needed, taking two puffs when wheezing becomes loud, but not on a daily basis- only 3 or 4 times since last here. She has not used the Trelegy inhaler sample provided previously and did not fill a prescription for it - not needed. She is currently using an albuterol  rescue inhaler . No daily wheezing and no recent rash.     Assessment and Plan:    Mild intermittent asthma Mild intermittent asthma with occasional wheezing, well-controlled with rescue inhaler. Maintenance inhaler not used due to cost and lack of necessity. - Refilled albuterol  rescue inhaler at [facility], refillable for one year. - Scheduled follow-up in one year to monitor asthma.   Urticaria-  notes hoarseness with temperature changes, but has not had visible rash in a long time.              ROS-see HPI   + = positive Constitutional:    weight loss, night sweats, fevers, chills, fatigue, lassitude. HEENT:    headaches, difficulty swallowing, tooth/dental probl ems, sore throat,       sneezing, itching, ear ache, nasal congestion, post  nasal drip, snoring CV:    chest pain, orthopnea, PND, swelling in lower extremities, anasarca,  dizziness, palpitations Resp:   shortness of breath with exertion or at rest.                productive cough,   non-productive cough, coughing up of blood.              change in color of mucus.  +wheezing.   Skin:    rash or lesions. GI:  No-   heartburn, indigestion, abdominal pain, nausea, vomiting, diarrhea,                 change in bowel habits, loss of appetite GU: dysuria, change in color of urine, no urgency or frequency.   flank pain. MS:   joint pain, stiffness, decreased range of motion, back pain. Neuro-     nothing unusual Psych:  change in mood or affect.  depression or anxiety.   memory loss.  OBJ- Physical Exam General- Alert, Oriented, Affect-appropriate, Distress- none acute. Skin- rash-none, lesions- none, excoriation- none Lymphadenopathy- none Head- atraumatic            Eyes- Gross vision intact, PERRLA, conjunctivae and secretions clear            Ears- Hearing, canals-normal            Nose-   no-Septal dev, + mucus, polyps, erosion, perforation             Throat- Mallampati III-IV, mucosa clear , drainage- none, tonsils- atrophic, torus Neck- flexible , trachea midline, no stridor , thyroid nl, carotid no bruit Chest - symmetrical excursion , unlabored  Heart/CV- RRR , no murmur , no gallop  , no rub, nl s1 s2                           - JVD- none , edema- none, stasis changes- none, varices- none           Lung- clear to P&A, wheeze- none, cough- none , dullness-none, rub- none           Chest wall-  Abd-  Br/ Gen/ Rectal- Not done, not indicated Extrem- cyanosis- none, clubbing, none, atrophy- none, strength- nl Neuro- grossly intact to observation

## 2023-11-27 ENCOUNTER — Encounter: Payer: Self-pay | Admitting: Internal Medicine

## 2023-11-27 ENCOUNTER — Ambulatory Visit: Payer: Managed Care, Other (non HMO) | Admitting: Internal Medicine

## 2023-11-27 VITALS — BP 130/80 | HR 78 | Ht 65.0 in | Wt 176.6 lb

## 2023-11-27 DIAGNOSIS — I10 Essential (primary) hypertension: Secondary | ICD-10-CM | POA: Diagnosis not present

## 2023-11-27 DIAGNOSIS — R0902 Hypoxemia: Secondary | ICD-10-CM

## 2023-11-27 DIAGNOSIS — L299 Pruritus, unspecified: Secondary | ICD-10-CM | POA: Diagnosis not present

## 2023-11-27 DIAGNOSIS — T7840XA Allergy, unspecified, initial encounter: Secondary | ICD-10-CM

## 2023-11-27 DIAGNOSIS — M62838 Other muscle spasm: Secondary | ICD-10-CM

## 2023-11-27 DIAGNOSIS — J4520 Mild intermittent asthma, uncomplicated: Secondary | ICD-10-CM

## 2023-11-27 MED ORDER — ALBUTEROL SULFATE HFA 108 (90 BASE) MCG/ACT IN AERS: 2.0000 | INHALATION_SPRAY | Freq: Four times a day (QID) | RESPIRATORY_TRACT | 12 refills | Status: AC | PRN

## 2023-11-27 NOTE — Patient Instructions (Signed)
 Refill sent for albuterol  rescue inhaler  Glad you are doing so well  Please call if we can help

## 2024-05-28 ENCOUNTER — Encounter: Payer: Self-pay | Admitting: Internal Medicine

## 2024-05-28 DIAGNOSIS — J4520 Mild intermittent asthma, uncomplicated: Secondary | ICD-10-CM

## 2024-06-04 NOTE — Telephone Encounter (Signed)
 Happy to do it- good doctor Please refer patient to Dr Clotilda as required.

## 2024-06-04 NOTE — Telephone Encounter (Signed)
 Dr. Neysa, Please see patient message regarding request for referral to pulmonary MD with [facility] d/t insurance reasons after the first of the year.  Thank you.

## 2024-06-04 NOTE — Telephone Encounter (Signed)
 Referral was made. Pt aware.
